# Patient Record
Sex: Female | Born: 1955 | Race: Black or African American | Hispanic: No | Smoking: Former smoker
Health system: Southern US, Community
[De-identification: ages and names within clinical notes are randomized; demographics above are authoritative.]

## PROBLEM LIST (undated history)

## (undated) DIAGNOSIS — H409 Unspecified glaucoma: Secondary | ICD-10-CM

## (undated) DIAGNOSIS — E785 Hyperlipidemia, unspecified: Secondary | ICD-10-CM

## (undated) DIAGNOSIS — F29 Unspecified psychosis not due to a substance or known physiological condition: Secondary | ICD-10-CM

## (undated) DIAGNOSIS — K219 Gastro-esophageal reflux disease without esophagitis: Secondary | ICD-10-CM

## (undated) DIAGNOSIS — E119 Type 2 diabetes mellitus without complications: Secondary | ICD-10-CM

## (undated) DIAGNOSIS — I10 Essential (primary) hypertension: Secondary | ICD-10-CM

## (undated) HISTORY — DX: Gastro-esophageal reflux disease without esophagitis: K21.9

## (undated) HISTORY — DX: Hyperlipidemia, unspecified: E78.5

## (undated) HISTORY — PX: ABDOMINAL HYSTERECTOMY: SHX81

## (undated) HISTORY — DX: Unspecified psychosis not due to a substance or known physiological condition: F29

## (undated) HISTORY — DX: Essential (primary) hypertension: I10

## (undated) HISTORY — DX: Unspecified glaucoma: H40.9

## (undated) HISTORY — DX: Type 2 diabetes mellitus without complications: E11.9

---

## 2012-08-06 ENCOUNTER — Ambulatory Visit (HOSPITAL_COMMUNITY): Payer: Self-pay | Admitting: Psychiatry

## 2012-08-14 ENCOUNTER — Ambulatory Visit (INDEPENDENT_AMBULATORY_CARE_PROVIDER_SITE_OTHER): Payer: 59 | Admitting: Psychiatry

## 2012-08-14 ENCOUNTER — Encounter (HOSPITAL_COMMUNITY): Payer: Self-pay | Admitting: Psychiatry

## 2012-08-14 VITALS — Ht 71.5 in | Wt 244.2 lb

## 2012-08-14 DIAGNOSIS — M79609 Pain in unspecified limb: Secondary | ICD-10-CM

## 2012-08-14 DIAGNOSIS — F29 Unspecified psychosis not due to a substance or known physiological condition: Secondary | ICD-10-CM

## 2012-08-14 MED ORDER — DICLOFENAC SODIUM 1 % TD GEL: 2.0000 g | Freq: Four times a day (QID) | TRANSDERMAL | Status: DC

## 2012-08-14 MED ORDER — HALOPERIDOL 1 MG PO TABS: 1.0000 mg | ORAL_TABLET | Freq: Three times a day (TID) | ORAL | Status: DC

## 2012-08-14 MED ORDER — BENZTROPINE MESYLATE 0.5 MG PO TABS: 0.5000 mg | ORAL_TABLET | Freq: Two times a day (BID) | ORAL | Status: DC

## 2012-08-14 NOTE — Patient Instructions (Signed)
Walking 8 minutes a day and increasing this gradually, BUT keeping a record and bringing that record in to the next visit would be very helpful.  Call if problems or concerns.  

## 2012-08-14 NOTE — Progress Notes (Signed)
Psychiatric Assessment Adult  Patient Identification:  Alexandria Schultz Date of Evaluation:  08/14/2012 Chief Complaint: "I've got a problem and I can't handle it myself.    History of Chief Complaint:   Beginning 9 years ago the pt began noticing smells and odors of things that were quite disturbing for her.  She got started on Zyprexa for this by her family doctor and got a good response.  She has since developed diabetes.  Her insurance company is not covering the Zyprexa any more.  She was referred to some psychiatrists in [place], but didn't go because she thought that she could handle this herself.  When she takes the meds she doesn't notice odors from everything, things in her environment and things that she eats.  Without the meds she notices them and hears people talking about her strong odors and feels rejection from them for that.   HPI Review of Systems  Constitutional: Negative.   HENT: Negative.   Eyes: Negative.   Respiratory: Negative.   Cardiovascular: Negative.   Gastrointestinal: Negative.   Endocrine: Positive for polydipsia and polyuria.  Musculoskeletal: Positive for myalgias.  Skin: Negative.   Allergic/Immunologic: Negative.   Neurological: Positive for light-headedness. Negative for dizziness, tremors, seizures, syncope, facial asymmetry, speech difficulty, weakness, numbness and headaches.  Hematological: Negative.   Psychiatric/Behavioral: Positive for hallucinations and behavioral problems. Negative for suicidal ideas, confusion, sleep disturbance, self-injury, dysphoric mood, decreased concentration and agitation. The patient is not nervous/anxious and is not hyperactive.    Physical Exam  Depressive Symptoms: decreased labido,  (Hypo) Manic Symptoms:   Irritable and delusions of a odor about her body, but no other symptoms  Anxiety Symptoms: Excessive Worry:  No Panic Symptoms:  No Agoraphobia:  No Obsessive Compulsive: No Specific Phobias:   No Social Anxiety:  No  Psychotic Symptoms:  Hallucinations: Yes Olfactory Delusions:  Yes Paranoia:  Yes   Ideas of Reference:  Yes See HPI  PTSD Symptoms: Ever had a traumatic exposure:  No Had a traumatic exposure in the last month:  No Re-experiencing: No  Hypervigilance:  No Hyperarousal: No Avoidance: No   Traumatic Brain Injury: No   Past Psychiatric History: Diagnosis: Schizophrenia by PCP who started Zyprexa   Hospitalizations: none  Outpatient Care: PCP  Substance Abuse Care: none  Self-Mutilation: none  Suicidal Attempts: none  Violent Behaviors: none   Past Medical History:   Past Medical History  Diagnosis Date  . Diabetes mellitus, type II   . GERD (gastroesophageal reflux disease)    History of Loss of Consciousness:  No Seizure History:  No Cardiac History:  No Allergies:  No Known Allergies Current Medications:  Current Outpatient Prescriptions  Medication Sig Dispense Refill  . atorvastatin (LIPITOR) 10 MG tablet       . lisinopril (PRINIVIL,ZESTRIL) 10 MG tablet       . metFORMIN (GLUCOPHAGE) 500 MG tablet       . timolol (TIMOPTIC) 0.5 % ophthalmic solution       . TRAVATAN Z 0.004 % SOLN ophthalmic solution       . Vitamin D, Ergocalciferol, (DRISDOL) 50000 UNITS CAPS       . HYDROcodone-acetaminophen (NORCO) 10-325 MG per tablet        No current facility-administered medications for this visit.    Previous Psychotropic Medications:  Medication Dose   Zyprexa  10 mg  Substance Abuse History in the last 12 months: Substance Age of 1st Use Last Use Amount Specific Type  Nicotine  15  12 years ago      Alcohol  18  2 days ago  80 oz beer    Cannabis  20  2 months ago      Opiates  56  yesterday  Tramodol    Cocaine  none        Methamphetamines  none        LSD  none        Ecstasy  none         Benzodiazepines  late 20's  30 yrs ago      Caffeine  20's   yesterday AM  1 cup  Folgers  Inhalants  none         Others:       Sugar  childhood  yesterday in coffee                  Medical Consequences of Substance Abuse: none  Legal Consequences of Substance Abuse: none  Family Consequences of Substance Abuse: none  Blackouts:  No DT's:  No Withdrawal Symptoms:  No   Social History: Current Place of Residence: [address] [place] [address] Place of Birth: [facility] Family Members: husband Marital Status:  Married Children: 0  Sons: 0  Daughters: 0 Relationships: husband Education:  10th grade completed, attended 11th grade, went to work Educational Problems/Performance: none Religious Beliefs/Practices: Holy History of Abuse: none Armed forces technical officer; Horticulturist, commercial History:  None. Legal History: none Hobbies/Interests: TV, occasionally garden and rarely goes for walks  Family History:   Family History  Problem Relation Age of Onset  . ADD / ADHD Neg Hx   . Alcohol abuse Neg Hx   . Drug abuse Neg Hx   . Bipolar disorder Neg Hx   . Dementia Neg Hx   . Depression Neg Hx   . OCD Neg Hx   . Paranoid behavior Neg Hx   . Schizophrenia Neg Hx   . Seizures Neg Hx   . Sexual abuse Neg Hx   . Physical abuse Neg Hx   . Anxiety disorder Other     Mental Status Examination/Evaluation: Objective:  Appearance: Casual  Eye Contact::  Good  Speech:  Clear and Coherent  Volume:  Normal  Mood:  Hopeful about this helping her.  Affect:  Congruent  Thought Process:  Coherent, Intact and Logical  Orientation:  Full (Time, Place, and Person)  Thought Content:  Hallucinations: Olfactory  Suicidal Thoughts:  No  Homicidal Thoughts:  No  Judgement:  Good  Insight:  Fair  Psychomotor Activity:  Normal  Akathisia:  No  Handed:  Right  AIMS (if indicated):    Assets:  Communication Skills Desire for Improvement    Laboratory/X-Ray Psychological Evaluation(s)   probably needs a head CT  none   Assessment:    AXIS I Psychotic Disorder NOS   AXIS II Deferred  AXIS III Past Medical History  Diagnosis Date  . Diabetes mellitus, type II   . GERD (gastroesophageal reflux disease)      AXIS IV other psychosocial or environmental problems  AXIS V 51-60 moderate symptoms   Treatment Plan/Recommendations:  Laboratory:  none  Psychotherapy: as needed  Medications: Haldol and have Cogentin available  Routine PRN Medications:  Yes  Consultations: none  Safety Concerns:  none  Other:     Plan: I  took her vitals.  I reviewed CC, tobacco/med/surg Hx, meds effects/ side effects, problem list, therapies and responses as well as current situation/symptoms discussed options. See orders and pt instructions for more details.  Medical Decision Making Problem Points:  New problem, with additional work-up planned (4) and Review of psycho-social stressors (1) Data Points:  Review or order clinical lab tests (1) Review of new medications or change in dosage (2)  I certify that outpatient services furnished can reasonably be expected to improve the patient's condition.   Orson Aloe, MD, [facility]

## 2012-09-12 ENCOUNTER — Encounter (HOSPITAL_COMMUNITY): Payer: Self-pay | Admitting: Psychiatry

## 2012-09-12 ENCOUNTER — Ambulatory Visit (INDEPENDENT_AMBULATORY_CARE_PROVIDER_SITE_OTHER): Payer: 59 | Admitting: Psychiatry

## 2012-09-12 VITALS — Wt 245.0 lb

## 2012-09-12 DIAGNOSIS — F29 Unspecified psychosis not due to a substance or known physiological condition: Secondary | ICD-10-CM

## 2012-09-12 DIAGNOSIS — F411 Generalized anxiety disorder: Secondary | ICD-10-CM | POA: Insufficient documentation

## 2012-09-12 MED ORDER — HALOPERIDOL 1 MG PO TABS: 1.0000 mg | ORAL_TABLET | Freq: Three times a day (TID) | ORAL | Status: DC

## 2012-09-12 MED ORDER — DIAZEPAM 5 MG PO TABS: ORAL_TABLET | ORAL | Status: DC

## 2012-09-12 NOTE — Progress Notes (Signed)
[facility] [id number] Progress Note ERSEL WADLEIGH MRN: [id number] DOB: July 28, 1955 Age: 57 y.o.  Date: 09/12/2012 Start Time: 9:25 AM End Time: 9:50 AM  Chief Complaint: Chief Complaint  Patient presents with  . Other  . Follow-up  . Medication Refill   Subjective: "I'm still smelling myself, but I have not needed the medicine for the stiffness".   Psychosis is 9/10 where 0 is none and 10 is the worst.  Pt returns for follow-up appointment.   Pt reports that she is compliant with the psychotropic medications with fair benefit and no noticeable side effects.  She is able to eat without feeling her insides being on fire.  She still smells herself quite a bit and that gives her the score of 9.  She never got the CT of the head.  She hates to be in tight spaces and has been on Valium before.  Will prescribe that for the CT procedure.  History of Chief Complaint:   Beginning 9 years ago the pt began noticing smells and odors of things that were quite disturbing for her.  She got started on Zyprexa for this by her family doctor and got a good response.  She has since developed diabetes.  Her insurance company is not covering the Zyprexa any more.  She was referred to some psychiatrists in [place], but didn't go because she thought that she could handle this herself.  When she takes the meds she doesn't notice odors from everything, things in her environment and things that she eats.  Without the meds she notices them and hears people talking about her strong odors and feels rejection from them for that.   HPI Review of Systems  Constitutional: Negative.   HENT: Negative.   Eyes: Negative.   Respiratory: Negative.   Cardiovascular: Negative.   Gastrointestinal: Negative.   Endocrine: Positive for polydipsia and polyuria.  Musculoskeletal: Positive for myalgias.  Skin: Negative.   Allergic/Immunologic: Negative.   Neurological: Positive for light-headedness. Negative for dizziness,  tremors, seizures, syncope, facial asymmetry, speech difficulty, weakness, numbness and headaches.  Hematological: Negative.   Psychiatric/Behavioral: Positive for hallucinations and behavioral problems. Negative for suicidal ideas, confusion, sleep disturbance, self-injury, dysphoric mood, decreased concentration and agitation. The patient is not nervous/anxious and is not hyperactive.    Physical Exam  Depressive Symptoms: decreased labido,  (Hypo) Manic Symptoms:   Irritable and delusions of a odor about her body, but no other symptoms  Anxiety Symptoms: Excessive Worry:  No Panic Symptoms:  No Agoraphobia:  No Obsessive Compulsive: No Specific Phobias:  No Social Anxiety:  No  Psychotic Symptoms:  Hallucinations: Yes Olfactory Delusions:  Yes Paranoia:  Yes   Ideas of Reference:  Yes See HPI  PTSD Symptoms: Ever had a traumatic exposure:  No Had a traumatic exposure in the last month:  No Re-experiencing: No  Hypervigilance:  No Hyperarousal: No Avoidance: No   Traumatic Brain Injury: No   Past Psychiatric History: Diagnosis: Schizophrenia by PCP who started Zyprexa   Hospitalizations: none  Outpatient Care: PCP  Substance Abuse Care: none  Self-Mutilation: none  Suicidal Attempts: none  Violent Behaviors: none   Past Medical History:   Past Medical History  Diagnosis Date  . Diabetes mellitus, type II   . GERD (gastroesophageal reflux disease)   . Psychosis    History of Loss of Consciousness:  No Seizure History:  No Cardiac History:  No Allergies:  No Known Allergies Current Medications:  Current Outpatient Prescriptions  Medication Sig Dispense Refill  . diclofenac sodium (VOLTAREN) 1 % GEL Apply 2 g topically 4 (four) times daily.  100 g  0  . haloperidol (HALDOL) 1 MG tablet Take 1 tablet (1 mg total) by mouth 3 (three) times daily. Try 1/2 tab first then increase to the dose that works.  90 tablet  2  . Vitamin D, Ergocalciferol, (DRISDOL) 50000  UNITS CAPS       . atorvastatin (LIPITOR) 10 MG tablet       . benztropine (COGENTIN) 0.5 MG tablet Take 1 tablet (0.5 mg total) by mouth 2 (two) times daily. Only take for stiffness, may cause dry mouth, blurred vision, and constipation.  60 tablet  1  . HYDROcodone-acetaminophen (NORCO) 10-325 MG per tablet       . lisinopril (PRINIVIL,ZESTRIL) 10 MG tablet       . metFORMIN (GLUCOPHAGE) 500 MG tablet       . timolol (TIMOPTIC) 0.5 % ophthalmic solution       . TRAVATAN Z 0.004 % SOLN ophthalmic solution        No current facility-administered medications for this visit.   Previous Psychotropic Medications:  Medication Dose   Zyprexa  10 mg                     Substance Abuse History in the last 12 months: Substance Age of 1st Use Last Use Amount Specific Type  Nicotine  15  12 years ago      Alcohol  18  2 days ago  80 oz beer    Cannabis  20  2 months ago      Opiates  56  yesterday  Tramodol    Cocaine  none        Methamphetamines  none        LSD  none        Ecstasy  none         Benzodiazepines  late 20's  30 yrs ago      Caffeine  20's   yesterday AM  1 cup  Folgers  Inhalants  none        Others:       Sugar  childhood  yesterday in coffee                  Medical Consequences of Substance Abuse: none  Legal Consequences of Substance Abuse: none  Family Consequences of Substance Abuse: none  Blackouts:  No DT's:  No Withdrawal Symptoms:  No   Social History: Current Place of Residence: [address] [place] [address] Place of Birth: [facility] Family Members: husband Marital Status:  Married Children: 0  Sons: 0  Daughters: 0 Relationships: husband Education:  10th grade completed, attended 11th grade, went to work Educational Problems/Performance: none Religious Beliefs/Practices: Holy History of Abuse: none Armed forces technical officer; Horticulturist, commercial History:  None. Legal History: none Hobbies/Interests: TV,  occasionally garden and rarely goes for walks  Family History:   Family History  Problem Relation Age of Onset  . ADD / ADHD Neg Hx   . Alcohol abuse Neg Hx   . Drug abuse Neg Hx   . Bipolar disorder Neg Hx   . Dementia Neg Hx   . Depression Neg Hx   . OCD Neg Hx   . Paranoid behavior Neg Hx   . Schizophrenia Neg Hx   . Seizures Neg Hx   . Sexual abuse Neg Hx   .  Physical abuse Neg Hx   . Anxiety disorder Other     Mental Status Examination/Evaluation: Objective:  Appearance: Casual  Eye Contact::  Good  Speech:  Clear and Coherent  Volume:  Normal  Mood:  Hopeful about this helping her.  Affect:  Congruent  Thought Process:  Coherent, Intact and Logical  Orientation:  Full (Time, Place, and Person)  Thought Content:  Hallucinations: Olfactory  Suicidal Thoughts:  No  Homicidal Thoughts:  No  Judgement:  Good  Insight:  Fair  Psychomotor Activity:  Normal  Akathisia:  No  Handed:  Right  AIMS (if indicated):    Assets:  Communication Skills Desire for Improvement    Laboratory/X-Ray Psychological Evaluation(s)   probably needs a head CT  none   Assessment:    AXIS I Psychotic Disorder NOS  AXIS II Deferred  AXIS III Past Medical History  Diagnosis Date  . Diabetes mellitus, type II   . GERD (gastroesophageal reflux disease)   . Psychosis      AXIS IV other psychosocial or environmental problems  AXIS V 51-60 moderate symptoms   Treatment Plan/Recommendations:  Laboratory:  none  Psychotherapy: as needed  Medications: Haldol and have Cogentin available  Routine PRN Medications:  Yes  Consultations: none  Safety Concerns:  none  Other:     Plan: I took her vitals.  I reviewed CC, tobacco/med/surg Hx, meds effects/ side effects, problem list, therapies and responses as well as current situation/symptoms discussed options. Increase Haldol which seems to be working some.  Cont Cogentin as needed.  See orders and pt instructions for more  details.  Medical Decision Making Problem Points:  Established problem, stable/improving (1), Review of last therapy session (1) and Review of psycho-social stressors (1) Data Points:  Review or order clinical lab tests (1) Review of medication regiment & side effects (2) Review of new medications or change in dosage (2)  I certify that outpatient services furnished can reasonably be expected to improve the patient's condition.   Orson Aloe, MD, [facility]

## 2012-09-12 NOTE — Patient Instructions (Addendum)
Relaxation is the ultimate solution for you.  You can seek it through tub baths, bubble baths, essential oils or incense, walking or chatting with friends, listening to soft music, watching a candle burn and just letting all thoughts go and appreciating the true essence of the Creator.   Make an appointment with Peggy for counseling.  Call if problems or concerns.  Call if we hear anything more about the CT scan.

## 2012-09-18 ENCOUNTER — Ambulatory Visit (HOSPITAL_COMMUNITY): Payer: 59 | Admitting: Psychiatry

## 2012-09-19 ENCOUNTER — Ambulatory Visit (HOSPITAL_COMMUNITY): Payer: Managed Care, Other (non HMO)

## 2012-09-26 ENCOUNTER — Telehealth (HOSPITAL_COMMUNITY): Payer: Self-pay | Admitting: Psychiatry

## 2012-09-26 NOTE — Telephone Encounter (Signed)
Preauth for CT of head approved by phone with a Lexicographer at

## 2012-10-07 ENCOUNTER — Telehealth (HOSPITAL_COMMUNITY): Payer: Self-pay | Admitting: *Deleted

## 2012-10-13 ENCOUNTER — Encounter (HOSPITAL_COMMUNITY): Payer: Self-pay | Admitting: *Deleted

## 2012-10-13 ENCOUNTER — Ambulatory Visit (HOSPITAL_COMMUNITY): Payer: 59 | Admitting: Psychiatry

## 2012-10-14 ENCOUNTER — Telehealth (HOSPITAL_COMMUNITY): Payer: Self-pay | Admitting: Psychiatry

## 2012-10-14 ENCOUNTER — Ambulatory Visit (HOSPITAL_COMMUNITY)
Admission: RE | Admit: 2012-10-14 | Discharge: 2012-10-14 | Disposition: A | Discharge status: Home / Self Care | Payer: Managed Care, Other (non HMO) | Source: Ambulatory Visit | Attending: Psychiatry | Admitting: Psychiatry

## 2012-10-14 DIAGNOSIS — F29 Unspecified psychosis not due to a substance or known physiological condition: Secondary | ICD-10-CM

## 2012-10-14 DIAGNOSIS — R51 Headache: Secondary | ICD-10-CM | POA: Insufficient documentation

## 2012-10-14 NOTE — Telephone Encounter (Signed)
Answering machine did not identify the owner, so left message that this is Dr W trying got get a hold of someone with the initials of [employer] And requested that they call me back.

## 2012-10-20 ENCOUNTER — Ambulatory Visit (HOSPITAL_COMMUNITY): Payer: 59 | Admitting: Psychiatry

## 2012-10-24 ENCOUNTER — Ambulatory Visit (INDEPENDENT_AMBULATORY_CARE_PROVIDER_SITE_OTHER): Payer: 59 | Admitting: Psychiatry

## 2012-10-24 ENCOUNTER — Encounter (HOSPITAL_COMMUNITY): Payer: Self-pay | Admitting: Psychiatry

## 2012-10-24 VITALS — BP 131/92 | HR 61 | Ht 71.25 in | Wt 239.8 lb

## 2012-10-24 DIAGNOSIS — F411 Generalized anxiety disorder: Secondary | ICD-10-CM

## 2012-10-24 DIAGNOSIS — F29 Unspecified psychosis not due to a substance or known physiological condition: Secondary | ICD-10-CM

## 2012-10-24 DIAGNOSIS — M79609 Pain in unspecified limb: Secondary | ICD-10-CM

## 2012-10-24 MED ORDER — CHLORPROMAZINE HCL 25 MG PO TABS: 25.0000 mg | ORAL_TABLET | Freq: Three times a day (TID) | ORAL | Status: DC

## 2012-10-24 NOTE — Patient Instructions (Addendum)
Try to find the best anxiety controlling dose that you can get with the new medicine THORAZINE, then add back the Haldol if the smells and voices are still troublesome.  Set a timer for 8 or a certain number minutes and walk for that amount of time in the house or in the yard.  Mark the number of minutes on a calendar for that day.  Do that every day this week.  Then next week increase the time by 1 minutes and then mark the calendar with the number of minutes for that day.  Each week increase your exercise by one minute.  Keep a record of this so you can see the progress you are making.  Do this every day, just like eating and sleeping.  It is good for pain control, depression, and for your soul/spirit.  Bring the record in for your next visit so we can talk about your effort and how you feel with the new exercise program going and working for you.  Take care of yourself.  No one else is standing up to do the job and only you know what you need.   GET SERIOUS about taking care of yourself.  Do the next right thing and that often means doing something to care for yourself along the lines of are you hungry, are you angry, are you lonely, are you tired, are you scared?  [employer] is what that stands for.  Call if problems or concerns.

## 2012-10-24 NOTE — Progress Notes (Signed)
[facility] [id number] Progress Note OLISA QUESNEL MRN: [id number] DOB: 05-24-1956 Age: 57 y.o. Date: 10/24/2012 Start Time: 9:00 AM End Time: 9:23 AM  Chief Complaint: Chief Complaint  Patient presents with  . Other  . Follow-up  . Medication Refill   Subjective: "I'm still smelling myself and am now hearing voices".   Depression 10/10 and Anxiety 10/10, where 0 is none and 10 is the worst.  Psychosis is 0/10 today.  Pain is 0/10 today.  Pt returns for follow-up appointment.   Pt reports that she is compliant with the psychotropic medications with fair benefit and no noticeable side effects.  Her CT was typical.  She is noting considerable anxiety.  Discussed on a grid Zyprexa, Risperdal, Seroquel, Geodon, Abilify, Haldol, and Thorazine with cost, relief of psychosis/voices/smells, anxiety relief, and risk associated with diabetes.  Concluded that we will try Thorazine and then add Haldol back for the psychosis control.  Also reviewed the similarity of Prolixin and Navane for the psychosis control.  She can not afford the Zyprexa and doesn't want the diabetes risk with the atypical antipsychotics.    Pt seemed surprise that I suspected that she was having voices.  I explained that it is a typical associated symptom.   History of Chief Complaint:   Beginning 9 years ago the pt began noticing smells and odors of things that were quite disturbing for her.  She got started on Zyprexa for this by her family doctor and got a good response.  She has since developed diabetes.  Her insurance company is not covering the Zyprexa any more.  She was referred to some psychiatrists in [place], but didn't go because she thought that she could handle this herself.  When she takes the meds she doesn't notice odors from everything, things in her environment and things that she eats.  Without the meds she notices them and hears people talking about her strong odors and feels rejection from them for that.    HPI Review of Systems  Constitutional: Negative.   HENT: Negative.   Eyes: Negative.   Respiratory: Negative.   Cardiovascular: Negative.   Gastrointestinal: Negative.   Endocrine: Positive for polydipsia and polyuria.  Musculoskeletal: Positive for myalgias.  Skin: Negative.   Allergic/Immunologic: Negative.   Neurological: Positive for light-headedness. Negative for dizziness, tremors, seizures, syncope, facial asymmetry, speech difficulty, weakness, numbness and headaches.  Hematological: Negative.   Psychiatric/Behavioral: Positive for hallucinations and behavioral problems. Negative for suicidal ideas, confusion, sleep disturbance, self-injury, dysphoric mood, decreased concentration and agitation. The patient is not nervous/anxious and is not hyperactive.    Physical Exam  Depressive Symptoms: decreased labido,  (Hypo) Manic Symptoms:   Irritable and delusions of a odor about her body, but no other symptoms  Anxiety Symptoms: Excessive Worry:  No Panic Symptoms:  No Agoraphobia:  No Obsessive Compulsive: No Specific Phobias:  No Social Anxiety:  No  Psychotic Symptoms:  Hallucinations: Yes Olfactory Delusions:  Yes Paranoia:  Yes   Ideas of Reference:  Yes See HPI  PTSD Symptoms: Ever had a traumatic exposure:  No Had a traumatic exposure in the last month:  No Re-experiencing: No  Hypervigilance:  No Hyperarousal: No Avoidance: No   Traumatic Brain Injury: No   Past Psychiatric History: Diagnosis: Schizophrenia by PCP who started Zyprexa   Hospitalizations: none  Outpatient Care: PCP  Substance Abuse Care: none  Self-Mutilation: none  Suicidal Attempts: none  Violent Behaviors: none   Allergies: No Known Allergies  Past Medical History:   Past Medical History  Diagnosis Date  . Diabetes mellitus, type II   . GERD (gastroesophageal reflux disease)   . Psychosis    Past Surgical History: Past Surgical History  Procedure Laterality Date  .  Abdominal hysterectomy     History of Loss of Consciousness:  No Seizure History:  No Cardiac History:  No Allergies:  No Known Allergies Current Medications:  Current Outpatient Prescriptions  Medication Sig Dispense Refill  . atorvastatin (LIPITOR) 10 MG tablet       . benztropine (COGENTIN) 0.5 MG tablet Take 1 tablet (0.5 mg total) by mouth 2 (two) times daily. Only take for stiffness, may cause dry mouth, blurred vision, and constipation.  60 tablet  1  . diclofenac sodium (VOLTAREN) 1 % GEL Apply 2 g topically 4 (four) times daily.  100 g  0  . haloperidol (HALDOL) 1 MG tablet Take 1 tablet (1 mg total) by mouth 3 (three) times daily. Try 1 tab three times a day adjust the dose for what works best for you.  90 tablet  2  . HYDROcodone-acetaminophen (NORCO) 10-325 MG per tablet       . lisinopril (PRINIVIL,ZESTRIL) 10 MG tablet       . metFORMIN (GLUCOPHAGE) 500 MG tablet       . timolol (TIMOPTIC) 0.5 % ophthalmic solution       . TRAVATAN Z 0.004 % SOLN ophthalmic solution       . Vitamin D, Ergocalciferol, (DRISDOL) 50000 UNITS CAPS        No current facility-administered medications for this visit.   Previous Psychotropic Medications:  Medication Dose   Zyprexa  10 mg   Substance Abuse History in the last 12 months: Substance Age of 1st Use Last Use Amount Specific Type  Nicotine  15  12 years ago      Alcohol  18  2 days ago  80 oz beer    Cannabis  20  2 months ago      Opiates  56  yesterday  Tramodol    Cocaine  none        Methamphetamines  none        LSD  none        Ecstasy  none         Benzodiazepines  late 20's  30 yrs ago      Caffeine  20's   yesterday AM  1 cup  Folgers  Inhalants  none        Others:       Sugar  childhood  yesterday in coffee     Medical Consequences of Substance Abuse: none  Legal Consequences of Substance Abuse: none  Family Consequences of Substance Abuse: none  Blackouts:  No DT's:  No Withdrawal Symptoms:  No   Social  History: Current Place of Residence: [address] [place] [address] Place of Birth: [facility] Family Members: husband Marital Status:  Married Children: 0  Sons: 0  Daughters: 0 Relationships: husband Education:  10th grade completed, attended 11th grade, went to work Educational Problems/Performance: none Religious Beliefs/Practices: Holy History of Abuse: none Armed forces technical officer; Horticulturist, commercial History:  None. Legal History: none Hobbies/Interests: TV, occasionally garden and rarely goes for walks  Family History:   Family History  Problem Relation Age of Onset  . ADD / ADHD Neg Hx   . Alcohol abuse Neg Hx   . Drug abuse Neg Hx   .  Bipolar disorder Neg Hx   . Dementia Neg Hx   . Depression Neg Hx   . OCD Neg Hx   . Paranoid behavior Neg Hx   . Schizophrenia Neg Hx   . Seizures Neg Hx   . Sexual abuse Neg Hx   . Physical abuse Neg Hx   . Anxiety disorder Other    Mental Status Examination/Evaluation: Objective:  Appearance: Casual  Eye Contact::  Good  Speech:  Clear and Coherent  Volume:  Normal  Mood:  Hopeful about this helping her.  Affect:  Congruent  Thought Process:  Coherent, Intact and Logical  Orientation:  Full (Time, Place, and Person)  Thought Content:  Hallucinations: Olfactory  Suicidal Thoughts:  No  Homicidal Thoughts:  No  Judgement:  Good  Insight:  Fair  Psychomotor Activity:  Normal  Akathisia:  No  Handed:  Right  AIMS (if indicated):    Assets:  Communication Skills Desire for Improvement   Lab Results: No results found for this or any previous visit (from the past 2016 hour(s)). CT showed typical brain  Assessment:    AXIS I Psychotic Disorder NOS  AXIS II Deferred  AXIS III Past Medical History  Diagnosis Date  . Diabetes mellitus, type II   . GERD (gastroesophageal reflux disease)   . Psychosis     AXIS IV other psychosocial or environmental problems  AXIS V 51-60 moderate symptoms    Treatment Plan/Recommendations:  Laboratory:  none  Psychotherapy: as needed  Medications: Haldol and have Cogentin available  Routine PRN Medications:  Yes  Consultations: none  Safety Concerns:  none  Other:     Plan: I took her vitals.  I reviewed CC, tobacco/med/surg Hx, meds effects/ side effects, problem list, therapies and responses as well as current situation/symptoms discussed options. Switch to Thorazine for anxiety control and consider how effective it is for psychosis, add Haldol back if needed.  Use Cogentin with the Haldol See orders and pt instructions for more details.  MEDICATIONS this encounter: Meds ordered this encounter  Medications  . chlorproMAZINE (THORAZINE) 25 MG tablet    Sig: Take 1 tablet (25 mg total) by mouth 3 (three) times daily.    Dispense:  90 tablet    Refill:  1   Medical Decision Making Problem Points:  Established problem, stable/improving (1), Review of last therapy session (1) and Review of psycho-social stressors (1) Data Points:  Review or order clinical lab tests (1) Review of medication regiment & side effects (2) Review of new medications or change in dosage (2)  I certify that outpatient services furnished can reasonably be expected to improve the patient's condition.   Orson Aloe, MD, [facility]

## 2012-11-14 ENCOUNTER — Encounter (HOSPITAL_COMMUNITY): Payer: Self-pay | Admitting: Psychiatry

## 2012-11-14 ENCOUNTER — Ambulatory Visit (INDEPENDENT_AMBULATORY_CARE_PROVIDER_SITE_OTHER): Payer: 59 | Admitting: Psychiatry

## 2012-11-14 VITALS — BP 127/80 | HR 53 | Ht 71.0 in | Wt 233.0 lb

## 2012-11-14 DIAGNOSIS — F29 Unspecified psychosis not due to a substance or known physiological condition: Secondary | ICD-10-CM

## 2012-11-14 DIAGNOSIS — F411 Generalized anxiety disorder: Secondary | ICD-10-CM

## 2012-11-14 MED ORDER — CHLORPROMAZINE HCL 25 MG PO TABS: 25.0000 mg | ORAL_TABLET | Freq: Three times a day (TID) | ORAL | Status: DC

## 2012-11-14 NOTE — Patient Instructions (Signed)
You can add a small piece of the Thorazine during the day if you need it.  Set a timer for 8 or a certain number minutes and walk for that amount of time in the house or in the yard.  Mark the number of minutes on a calendar for that day.  Do that every day this week.  Then next week increase the time by 1 minutes and then mark the calendar with the number of minutes for that day.  Each week increase your exercise by one minute.  Keep a record of this so you can see the progress you are making.  Do this every day, just like eating and sleeping.  It is good for pain control, depression, and for your soul/spirit.  Bring the record in for your next visit so we can talk about your effort and how you feel with the new exercise program going and working for you.  "I am Wishes Fulfilled Meditation" by Marylene Buerger and Lyndal Pulley may be helpful MUSIC for getting to sleep or for meditating You can order it from on line.  You might find the Chill channel on Pandora and explore the artists that you like better.   Take care of yourself.  No one else is standing up to do the job and only you know what you need.   GET SERIOUS about taking care of yourself.  Do the next right thing and that often means doing something to care for yourself along the lines of are you hungry, are you angry, are you lonely, are you tired, are you scared?  HALTS is what that stands for.  Call if problems or concerns.  You will be seeing a different doctor at your next visit.  I have enjoyed working with you and feel privileged to have worked with you.

## 2012-11-14 NOTE — Progress Notes (Signed)
[facility] [id number] Progress Note LASHARN BUFKIN MRN: [id number] DOB: [birth year] Age: 57 y.o. Date: 11/14/2012 Start Time: 9:58 AM End Time: 10:06 AM  Chief Complaint: Chief Complaint  Patient presents with  . Other  . Follow-up  . Medication Refill   Subjective: "I'm smelling myself maybe a little bit, but with the medicine I am better.  I am not hearing voices as much".   Depression 0/10 and Anxiety 0/10, where 0 is none and 10 is the worst.  Psychosis is 2/10 today.  Pain is 0/10 today.  Pt returns for follow-up appointment.   Pt reports that she is compliant with the psychotropic medications with good benefit and some side effects.  She is noting some constipation with the Thorazine.  She can't take it three times a day because of sedation.  Once a day makes things a lot better for her.  History of Chief Complaint:   Beginning 9 years ago the pt began noticing smells and odors of things that were quite disturbing for her.  She got started on Zyprexa for this by her family doctor and got a good response.  She has since developed diabetes.  Her insurance company is not covering the Zyprexa any more.  She was referred to some psychiatrists in [place], but didn't go because she thought that she could handle this herself.  When she takes the meds she doesn't notice odors from everything, things in her environment and things that she eats.  Without the meds she notices them and hears people talking about her strong odors and feels rejection from them for that.   HPI Review of Systems  Constitutional: Negative.   HENT: Negative.   Eyes: Negative.   Respiratory: Negative.   Cardiovascular: Negative.   Gastrointestinal: Negative.   Endocrine: Positive for polydipsia and polyuria.  Musculoskeletal: Positive for myalgias.  Skin: Negative.   Allergic/Immunologic: Negative.   Neurological: Positive for light-headedness. Negative for dizziness, tremors, seizures, syncope, facial  asymmetry, speech difficulty, weakness, numbness and headaches.  Hematological: Negative.   Psychiatric/Behavioral: Positive for hallucinations and behavioral problems. Negative for suicidal ideas, confusion, sleep disturbance, self-injury, dysphoric mood, decreased concentration and agitation. The patient is not nervous/anxious and is not hyperactive.    Physical Exam  Depressive Symptoms: decreased labido,  (Hypo) Manic Symptoms:   Irritable and delusions of a odor about her body, but no other symptoms  Anxiety Symptoms: Excessive Worry:  No Panic Symptoms:  No Agoraphobia:  No Obsessive Compulsive: No Specific Phobias:  No Social Anxiety:  No  Psychotic Symptoms:  Hallucinations: Yes Olfactory Delusions:  Yes Paranoia:  Yes   Ideas of Reference:  Yes See HPI  PTSD Symptoms: Ever had a traumatic exposure:  No Had a traumatic exposure in the last month:  No Re-experiencing: No  Hypervigilance:  No Hyperarousal: No Avoidance: No   Traumatic Brain Injury: No   Past Psychiatric History: Diagnosis: Schizophrenia by PCP who started Zyprexa   Hospitalizations: none  Outpatient Care: PCP  Substance Abuse Care: none  Self-Mutilation: none  Suicidal Attempts: none  Violent Behaviors: none   Allergies: No Known Allergies  Past Medical History:   Past Medical History  Diagnosis Date  . Diabetes mellitus, type II   . GERD (gastroesophageal reflux disease)   . Psychosis    Past Surgical History: Past Surgical History  Procedure Laterality Date  . Abdominal hysterectomy     Family History: family history includes Anxiety disorder in her other.  There is no  history of ADD / ADHD, and Alcohol abuse, and Drug abuse, and Bipolar disorder, and Dementia, and Depression, and OCD, and Paranoid behavior, and Schizophrenia, and Seizures, and Sexual abuse, and Physical abuse, . Reviewed all this again today and it is the same  History of Loss of Consciousness:  No Seizure  History:  No Cardiac History:  No Allergies:  No Known Allergies Current Medications:  Current outpatient prescriptions:benztropine (COGENTIN) 0.5 MG tablet, Take 1 tablet (0.5 mg total) by mouth 2 (two) times daily. Only take for stiffness, may cause dry mouth, blurred vision, and constipation., Disp: 60 tablet, Rfl: 1;  chlorproMAZINE (THORAZINE) 25 MG tablet, Take 1 tablet (25 mg total) by mouth 3 (three) times daily., Disp: 270 tablet, Rfl: 0;  atorvastatin (LIPITOR) 10 MG tablet, , Disp: , Rfl:  diclofenac sodium (VOLTAREN) 1 % GEL, Apply 2 g topically 4 (four) times daily., Disp: 100 g, Rfl: 0;  HYDROcodone-acetaminophen (NORCO) 10-325 MG per tablet, , Disp: , Rfl: ;  lisinopril (PRINIVIL,ZESTRIL) 10 MG tablet, , Disp: , Rfl: ;  metFORMIN (GLUCOPHAGE) 500 MG tablet, , Disp: , Rfl: ;  timolol (TIMOPTIC) 0.5 % ophthalmic solution, , Disp: , Rfl: ;  TRAVATAN Z 0.004 % SOLN ophthalmic solution, , Disp: , Rfl:  Vitamin D, Ergocalciferol, (DRISDOL) 50000 UNITS CAPS, , Disp: , Rfl:   Previous Psychotropic Medications:  Medication Dose   Zyprexa  10 mg   Substance Abuse History in the last 12 months: Substance Age of 1st Use Last Use Amount Specific Type  Nicotine  15  12 years ago      Alcohol  18  2 days ago  80 oz beer    Cannabis  20  2 months ago      Opiates  56  yesterday  Tramodol    Cocaine  none        Methamphetamines  none        LSD  none        Ecstasy  none         Benzodiazepines  late 20's  30 yrs ago      Caffeine  20's   yesterday AM  1 cup  Folgers  Inhalants  none        Others:       Sugar  childhood  yesterday in coffee     Medical Consequences of Substance Abuse: none  Legal Consequences of Substance Abuse: none  Family Consequences of Substance Abuse: none  Blackouts:  No DT's:  No Withdrawal Symptoms:  No   Social History: Current Place of Residence: [address] [place] [address] Place of Birth: [facility] Family Members: husband Marital  Status:  Married Children: 0  Sons: 0  Daughters: 0 Relationships: husband Education:  10th grade completed, attended 11th grade, went to work Educational Problems/Performance: none Religious Beliefs/Practices: Holy History of Abuse: none Armed forces technical officer; Horticulturist, commercial History:  None. Legal History: none Hobbies/Interests: TV, occasionally garden and rarely goes for walks  Family History:   Family History  Problem Relation Age of Onset  . ADD / ADHD Neg Hx   . Alcohol abuse Neg Hx   . Drug abuse Neg Hx   . Bipolar disorder Neg Hx   . Dementia Neg Hx   . Depression Neg Hx   . OCD Neg Hx   . Paranoid behavior Neg Hx   . Schizophrenia Neg Hx   . Seizures Neg Hx   . Sexual abuse Neg  Hx   . Physical abuse Neg Hx   . Anxiety disorder Other    Mental Status Examination/Evaluation: Objective:  Appearance: Casual  Eye Contact::  Good  Speech:  Clear and Coherent  Volume:  Normal  Mood:  Hopeful about this helping her.  Affect:  Congruent  Thought Process:  Coherent, Intact and Logical  Orientation:  Full (Time, Place, and Person)  Thought Content:  Hallucinations: Olfactory  Suicidal Thoughts:  No  Homicidal Thoughts:  No  Judgement:  Good  Insight:  Fair  Psychomotor Activity:  Normal  Akathisia:  No  Handed:  Right  AIMS (if indicated):    Assets:  Communication Skills Desire for Improvement   Lab Results: No results found for this or any previous visit (from the past 2016 hour(s)). CT showed typical brain  Assessment:    AXIS I Psychotic Disorder NOS  AXIS II Deferred  AXIS III Past Medical History  Diagnosis Date  . Diabetes mellitus, type II   . GERD (gastroesophageal reflux disease)   . Psychosis     AXIS IV other psychosocial or environmental problems  AXIS V 51-60 moderate symptoms   Treatment Plan/Recommendations:  Laboratory:  none  Psychotherapy: as needed  Medications: Haldol and have Cogentin available  Routine PRN  Medications:  Yes  Consultations: none  Safety Concerns:  none  Other:     Plan: I took her vitals.  I reviewed CC, tobacco/med/surg Hx, meds effects/ side effects, problem list, therapies and responses as well as current situation/symptoms discussed options. Continue Thorazine See orders and pt instructions for more details.  MEDICATIONS this encounter: Meds ordered this encounter  Medications  . chlorproMAZINE (THORAZINE) 25 MG tablet    Sig: Take 1 tablet (25 mg total) by mouth 3 (three) times daily.    Dispense:  270 tablet    Refill:  0   Medical Decision Making Problem Points:  Established problem, stable/improving (1), Review of last therapy session (1) and Review of psycho-social stressors (1) Data Points:  Review or order clinical lab tests (1) Review of medication regiment & side effects (2) Review of new medications or change in dosage (2)  I certify that outpatient services furnished can reasonably be expected to improve the patient's condition.   Orson Aloe, MD, [facility]

## 2013-01-13 ENCOUNTER — Ambulatory Visit (HOSPITAL_COMMUNITY): Payer: 59 | Admitting: Psychiatry

## 2013-05-01 ENCOUNTER — Encounter (HOSPITAL_COMMUNITY): Payer: Self-pay | Admitting: Psychiatry

## 2013-05-01 ENCOUNTER — Ambulatory Visit (INDEPENDENT_AMBULATORY_CARE_PROVIDER_SITE_OTHER): Payer: 59 | Admitting: Psychiatry

## 2013-05-01 VITALS — BP 140/80 | Ht 71.0 in | Wt 222.0 lb

## 2013-05-01 DIAGNOSIS — F29 Unspecified psychosis not due to a substance or known physiological condition: Secondary | ICD-10-CM

## 2013-05-01 DIAGNOSIS — M79609 Pain in unspecified limb: Secondary | ICD-10-CM

## 2013-05-01 DIAGNOSIS — F411 Generalized anxiety disorder: Secondary | ICD-10-CM

## 2013-05-01 MED ORDER — CHLORPROMAZINE HCL 25 MG PO TABS: 25.0000 mg | ORAL_TABLET | Freq: Three times a day (TID) | ORAL | Status: DC

## 2013-05-01 NOTE — Progress Notes (Signed)
Patient ID: Alexandria Schultz, female   DOB: Apr 12, 1956, 57 y.o.   MRN: [id number] [facility] [id number] Progress Note ANIQA HARE MRN: [id number] DOB: 10-20-1955 Age: 58 y.o. Date: 05/01/2013 Start Time: 9:58 AM End Time: 10:06 AM  Chief Complaint: Chief Complaint  Patient presents with  . Altered Mental Status  . Follow-up   Subjective: "I'm smelling myself maybe a little bit, but with the medicine I am better.  I am not hearing voices as much".   This patient is a 57 year old black female lives with her husband in [place]. She works in a Geneticist, molecular. She has no children.  Apparently at the patient started having odd smells after she ate food. This started several years ago. She was started on Zyprexa but she couldn't afford it and she suspects it caused her diabetes. She claims now that she is on Thorazine and smells have gone away and she's not hearing any voices or having paranoid symptoms. She claims this happened because she was "stressed" but doesn't want to talk about what the stress entailed. She states her mood is good and she is sleeping well. Of note both she and her husband drink a quart of beer per day. I explained that this was way too much and this would need to be cut back. I don't know her primary doctor has checked a liver panel but we will get the records.  History of Chief Complaint:   Beginning 9 years ago the pt began noticing smells and odors of things that were quite disturbing for her.  She got started on Zyprexa for this by her family doctor and got a good response.  She has since developed diabetes.  Her insurance company is not covering the Zyprexa any more.  She was referred to some psychiatrists in [place], but didn't go because she thought that she could handle this herself.  When she takes the meds she doesn't notice odors from everything, things in her environment and things that she eats.  Without the meds she notices them and hears people talking about her  strong odors and feels rejection from them for that.   Altered Mental Status Associated symptoms include myalgias. Pertinent negatives include no headaches, numbness or weakness.   Review of Systems  Constitutional: Negative.   HENT: Negative.   Eyes: Negative.   Respiratory: Negative.   Cardiovascular: Negative.   Gastrointestinal: Negative.   Endocrine: Positive for polydipsia and polyuria.  Musculoskeletal: Positive for myalgias.  Skin: Negative.   Allergic/Immunologic: Negative.   Neurological: Positive for light-headedness. Negative for dizziness, tremors, seizures, syncope, facial asymmetry, speech difficulty, weakness, numbness and headaches.  Hematological: Negative.   Psychiatric/Behavioral: Positive for hallucinations and behavioral problems. Negative for suicidal ideas, confusion, sleep disturbance, self-injury, dysphoric mood, decreased concentration and agitation. The patient is not nervous/anxious and is not hyperactive.    Physical Exam  Depressive Symptoms: decreased labido,  (Hypo) Manic Symptoms:   Irritable and delusions of a odor about her body, but no other symptoms  Anxiety Symptoms: Excessive Worry:  No Panic Symptoms:  No Agoraphobia:  No Obsessive Compulsive: No Specific Phobias:  No Social Anxiety:  No  Psychotic Symptoms:  Hallucinations: Yes Olfactory Delusions:  Yes Paranoia:  Yes   Ideas of Reference:  Yes See HPI  PTSD Symptoms: Ever had a traumatic exposure:  No Had a traumatic exposure in the last month:  No Re-experiencing: No  Hypervigilance:  No Hyperarousal: No Avoidance: No   Traumatic Brain Injury: No  Past Psychiatric History: Diagnosis: Schizophrenia by PCP who started Zyprexa   Hospitalizations: none  Outpatient Care: PCP  Substance Abuse Care: none  Self-Mutilation: none  Suicidal Attempts: none  Violent Behaviors: none   Allergies: Allergies  Allergen Reactions  . Ibuprofen     Chest pain    Past Medical  History:   Past Medical History  Diagnosis Date  . Diabetes mellitus, type II   . GERD (gastroesophageal reflux disease)   . Psychosis   . Hypertension   . Glaucoma   . Hyperlipidemia    Past Surgical History: Past Surgical History  Procedure Laterality Date  . Abdominal hysterectomy     Family History: family history includes Anxiety disorder in her other and paternal aunt. There is no history of ADD / ADHD, Alcohol abuse, Drug abuse, Bipolar disorder, Dementia, Depression, OCD, Paranoid behavior, Schizophrenia, Seizures, Sexual abuse, or Physical abuse. Reviewed all this again today and it is the same  History of Loss of Consciousness:  No Seizure History:  No Cardiac History:  No Allergies:   Allergies  Allergen Reactions  . Ibuprofen     Chest pain   Current Medications:  Current outpatient prescriptions:atorvastatin (LIPITOR) 10 MG tablet, , Disp: , Rfl: ;  chlorproMAZINE (THORAZINE) 25 MG tablet, Take 1 tablet (25 mg total) by mouth 3 (three) times daily., Disp: 270 tablet, Rfl: 3;  lisinopril (PRINIVIL,ZESTRIL) 10 MG tablet, , Disp: , Rfl: ;  metFORMIN (GLUCOPHAGE) 500 MG tablet, , Disp: , Rfl: ;  timolol (TIMOPTIC) 0.5 % ophthalmic solution, , Disp: , Rfl:  TRAVATAN Z 0.004 % SOLN ophthalmic solution, , Disp: , Rfl: ;  Vitamin D, Ergocalciferol, (DRISDOL) 50000 UNITS CAPS, , Disp: , Rfl: ;  benztropine (COGENTIN) 0.5 MG tablet, Take 1 tablet (0.5 mg total) by mouth 2 (two) times daily. Only take for stiffness, may cause dry mouth, blurred vision, and constipation., Disp: 60 tablet, Rfl: 1  Previous Psychotropic Medications:  Medication Dose   Zyprexa  10 mg   Substance Abuse History in the last 12 months: Substance Age of 1st Use Last Use Amount Specific Type  Nicotine  15  12 years ago      Alcohol  18  2 days ago  80 oz beer    Cannabis  20  2 months ago      Opiates  56  yesterday  Tramodol    Cocaine  none        Methamphetamines  none        LSD  none         Ecstasy  none         Benzodiazepines  late 20's  30 yrs ago      Caffeine  20's   yesterday AM  1 cup  Folgers  Inhalants  none        Others:       Sugar  childhood  yesterday in coffee     Medical Consequences of Substance Abuse: none  Legal Consequences of Substance Abuse: none  Family Consequences of Substance Abuse: none  Blackouts:  No DT's:  No Withdrawal Symptoms:  No   Social History: Current Place of Residence: [address] [place] [address] Place of Birth: [facility] Family Members: husband Marital Status:  Married Children: 0  Sons: 0  Daughters: 0 Relationships: husband Education:  10th grade completed, attended 11th grade, went to work Educational Problems/Performance: none Religious Beliefs/Practices: Holy History of Abuse: none Armed forces technical officer; textile  Conservation officer, nature History:  None. Legal History: none Hobbies/Interests: TV, occasionally garden and rarely goes for walks  Family History:   Family History  Problem Relation Age of Onset  . ADD / ADHD Neg Hx   . Alcohol abuse Neg Hx   . Drug abuse Neg Hx   . Bipolar disorder Neg Hx   . Dementia Neg Hx   . Depression Neg Hx   . OCD Neg Hx   . Paranoid behavior Neg Hx   . Schizophrenia Neg Hx   . Seizures Neg Hx   . Sexual abuse Neg Hx   . Physical abuse Neg Hx   . Anxiety disorder Other   . Anxiety disorder Paternal Aunt    Mental Status Examination/Evaluation: Objective:  Appearance: Casual  Eye Contact::  Good  Speech:  Clear and Coherent  Volume:  Normal  Mood:  Euthymic   Affect:  Congruent  Thought Process:  Coherent, Intact and Logical  Orientation:  Full (Time, Place, and Person)  Thought Content:  Hallucinations: Olfactory are gone at present   Suicidal Thoughts:  No  Homicidal Thoughts:  No  Judgement:  Good  Insight:  Fair  Psychomotor Activity:  Normal  Akathisia:  No  Handed:  Right  AIMS (if indicated):    Assets:  Communication Skills Desire  for Improvement   Lab Results: No results found for this or any previous visit (from the past 2016 hour(s)). CT showed typical brain  Assessment:    AXIS I Psychotic Disorder NOS  AXIS II Deferred  AXIS III Past Medical History  Diagnosis Date  . Diabetes mellitus, type II   . GERD (gastroesophageal reflux disease)   . Psychosis   . Hypertension   . Glaucoma   . Hyperlipidemia     AXIS IV other psychosocial or environmental problems  AXIS V 51-60 moderate symptoms   Treatment Plan/Recommendations:  Laboratory:  none  Psychotherapy: as needed  Medications: Thorazine, she has never use the Cogentin   Routine PRN Medications:  Yes  Consultations: none  Safety Concerns:  none  Other:     Plan: I took her vitals.  I reviewed CC, tobacco/med/surg Hx, meds effects/ side effects, problem list, therapies and responses as well as current situation/symptoms discussed options. Continue Thorazine. She was warned about the dangers of drinking too much. We will get her medical records from her primary Dr. Doreatha Lew return in 3 months See orders and pt instructions for more details.  MEDICATIONS this encounter: Meds ordered this encounter  Medications  . chlorproMAZINE (THORAZINE) 25 MG tablet    Sig: Take 1 tablet (25 mg total) by mouth 3 (three) times daily.    Dispense:  270 tablet    Refill:  3   Medical Decision Making Problem Points:  Established problem, stable/improving (1), Review of last therapy session (1) and Review of psycho-social stressors (1) Data Points:  Review or order clinical lab tests (1) Review of medication regiment & side effects (2) Review of new medications or change in dosage (2)  I certify that outpatient services furnished can reasonably be expected to improve the patient's condition.   Diannia Ruder, MD, [facility]

## 2013-07-31 ENCOUNTER — Ambulatory Visit (HOSPITAL_COMMUNITY): Payer: 59 | Admitting: Psychiatry

## 2013-08-24 ENCOUNTER — Ambulatory Visit (INDEPENDENT_AMBULATORY_CARE_PROVIDER_SITE_OTHER): Payer: 59 | Admitting: Psychiatry

## 2013-08-24 ENCOUNTER — Encounter (HOSPITAL_COMMUNITY): Payer: Self-pay | Admitting: Psychiatry

## 2013-08-24 VITALS — BP 120/90 | Ht 71.0 in | Wt 233.0 lb

## 2013-08-24 DIAGNOSIS — F411 Generalized anxiety disorder: Secondary | ICD-10-CM

## 2013-08-24 DIAGNOSIS — F29 Unspecified psychosis not due to a substance or known physiological condition: Secondary | ICD-10-CM

## 2013-08-24 NOTE — Progress Notes (Signed)
Patient ID: Alexandria Schultz, female   DOB: 11/01/1955, 58 y.o.   MRN: [id number] Patient ID: Alexandria Schultz, female   DOB: 11/15/1955, 58 y.o.   MRN: [id number] [facility] [id number] Progress Note Alexandria Schultz MRN: [id number] DOB: 04/11/1956 Age: 58 y.o. Date: 08/24/2013 Start Time: 9:58 AM End Time: 10:06 AM  Chief Complaint: Chief Complaint  Patient presents with  . Schizophrenia   Subjective: "I'm doing well ".   This patient is a 58 year old black female lives with her husband in [place]. 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 She has no children.  Apparently at the patient started having odd smells after she ate food. This started several years ago. She was started on Zyprexa but she couldn't afford it and she suspects it caused her diabetes. She claims now that she is on Thorazine and smells have gone away and she's not hearing any voices or having paranoid symptoms. She claims this happened because she was "stressed" but doesn't want to talk about what the stress entailed. She states her mood is good and she is sleeping well. Of note both she and her husband drink a quart of beer per day. I explained that this was way too much and this would need to be cut back. I don't know her primary doctor has checked a liver panel but we will get the records. The patient returns after 3 months. She states that she's doing very well. She go on her has odd smells or hears voices. She's not paranoid. She is cut or Thorazine down to just one pill a day because was making her too drowsy. She is sleeping well and continues to work her mood has been good. She admits that she and her husband still drink 2 or 3 glasses of beer after work everyday  History of Chief Complaint:   Beginning 9 years ago the pt began noticing smells and odors of things that were quite disturbing for her.  She got started on Zyprexa for this by her family doctor and got a good response.  She has since developed diabetes.  Her insurance company is  not covering the Zyprexa any more.  She was referred to some psychiatrists in [place], but didn't go because she thought that she could handle this herself.  When she takes the meds she doesn't notice odors from everything, things in her environment and things that she eats.  Without the meds she notices them and hears people talking about her strong odors and feels rejection from them for that.   Altered Mental Status Associated symptoms include myalgias. Pertinent negatives include no headaches, numbness or weakness.   Review of Systems  Constitutional: Negative.   HENT: Negative.   Eyes: Negative.   Respiratory: Negative.   Cardiovascular: Negative.   Gastrointestinal: Negative.   Endocrine: Positive for polydipsia and polyuria.  Musculoskeletal: Positive for myalgias.  Skin: Negative.   Allergic/Immunologic: Negative.   Neurological: Positive for light-headedness. Negative for dizziness, tremors, seizures, syncope, facial asymmetry, speech difficulty, weakness, numbness and headaches.  Hematological: Negative.   Psychiatric/Behavioral: Positive for hallucinations and behavioral problems. Negative for suicidal ideas, confusion, sleep disturbance, self-injury, dysphoric mood, decreased concentration and agitation. The patient is not nervous/anxious and is not hyperactive.    Physical Exam  Depressive Symptoms: decreased labido,  (Hypo) Manic Symptoms:   Irritable and delusions of a odor about her body, but no other symptoms  Anxiety Symptoms: Excessive Worry:  No Panic Symptoms:  No Agoraphobia:  No Obsessive  Compulsive: No Specific Phobias:  No Social Anxiety:  No  Psychotic Symptoms:  Hallucinations: Yes Olfactory Delusions:  Yes Paranoia:  Yes   Ideas of Reference:  Yes See HPI  PTSD Symptoms: Ever had a traumatic exposure:  No Had a traumatic exposure in the last month:  No Re-experiencing: No  Hypervigilance:  No Hyperarousal: No Avoidance: No   Traumatic  Brain Injury: No   Past Psychiatric History: Diagnosis: Schizophrenia by PCP who started Zyprexa   Hospitalizations: none  Outpatient Care: PCP  Substance Abuse Care: none  Self-Mutilation: none  Suicidal Attempts: none  Violent Behaviors: none   Allergies: Allergies  Allergen Reactions  . Ibuprofen     Chest pain    Past Medical History:   Past Medical History  Diagnosis Date  . Diabetes mellitus, type II   . GERD (gastroesophageal reflux disease)   . Psychosis   . Hypertension   . Glaucoma   . Hyperlipidemia    Past Surgical History: Past Surgical History  Procedure Laterality Date  . Abdominal hysterectomy     Family History: family history includes Anxiety disorder in her other and paternal aunt. There is no history of ADD / ADHD, Alcohol abuse, Drug abuse, Bipolar disorder, Dementia, Depression, OCD, Paranoid behavior, Schizophrenia, Seizures, Sexual abuse, or Physical abuse. Reviewed all this again today and it is the same  History of Loss of Consciousness:  No Seizure History:  No Cardiac History:  No Allergies:   Allergies  Allergen Reactions  . Ibuprofen     Chest pain   Current Medications:  Current outpatient prescriptions:atorvastatin (LIPITOR) 10 MG tablet, , Disp: , Rfl: ;  benztropine (COGENTIN) 0.5 MG tablet, Take 1 tablet (0.5 mg total) by mouth 2 (two) times daily. Only take for stiffness, may cause dry mouth, blurred vision, and constipation., Disp: 60 tablet, Rfl: 1;  chlorproMAZINE (THORAZINE) 25 MG tablet, Take 1 tablet (25 mg total) by mouth 3 (three) times daily., Disp: 270 tablet, Rfl: 3 lisinopril (PRINIVIL,ZESTRIL) 10 MG tablet, , Disp: , Rfl: ;  metFORMIN (GLUCOPHAGE) 500 MG tablet, , Disp: , Rfl: ;  timolol (TIMOPTIC) 0.5 % ophthalmic solution, , Disp: , Rfl: ;  TRAVATAN Z 0.004 % SOLN ophthalmic solution, , Disp: , Rfl: ;  Vitamin D, Ergocalciferol, (DRISDOL) 50000 UNITS CAPS, , Disp: , Rfl:   Previous Psychotropic  Medications:  Medication Dose   Zyprexa  10 mg   Substance Abuse History in the last 12 months: Substance Age of 1st Use Last Use Amount Specific Type  Nicotine  15  12 years ago      Alcohol  18  2 days ago  80 oz beer    Cannabis  20  2 months ago      Opiates  56  yesterday  Tramodol    Cocaine  none        Methamphetamines  none        LSD  none        Ecstasy  none         Benzodiazepines  late 20's  30 yrs ago      Caffeine  20's   yesterday AM  1 cup  [place]gers  Inhalants  none        Others:       Sugar  childhood  yesterday in coffee     Medical Consequences of Substance Abuse: none  Legal Consequences of Substance Abuse: none  Family Consequences of Substance Abuse: none  Blackouts:  No DT's:  No Withdrawal Symptoms:  No   Social History: Current Place of Residence: [address] [place] [address] Place of Birth: [facility] Family Members: husband Marital Status:  Married Children: 0  Sons: 0  Daughters: 0 Relationships: husband Education:  10th grade completed, attended 11th grade, went to work Educational Problems/Performance: none Religious Beliefs/Practices: Holy History of Abuse: none Armed forces technical officer; Horticulturist, commercial History:  None. Legal History: none Hobbies/Interests: TV, occasionally garden and rarely goes for walks  Family History:   Family History  Problem Relation Age of Onset  . ADD / ADHD Neg Hx   . Alcohol abuse Neg Hx   . Drug abuse Neg Hx   . Bipolar disorder Neg Hx   . Dementia Neg Hx   . Depression Neg Hx   . OCD Neg Hx   . Paranoid behavior Neg Hx   . Schizophrenia Neg Hx   . Seizures Neg Hx   . Sexual abuse Neg Hx   . Physical abuse Neg Hx   . Anxiety disorder Other   . Anxiety disorder Paternal Aunt    Mental Status Examination/Evaluation: Objective:  Appearance: Casual  Eye Contact::  Good  Speech:  Clear and Coherent  Volume:  Normal  Mood:  Euthymic   Affect:  Congruent  Thought  Process:  Coherent, Intact and Logical  Orientation:  Full (Time, Place, and Person)  Thought Content:  Hallucinations: Olfactory are gone at present   Suicidal Thoughts:  No  Homicidal Thoughts:  No  Judgement:  Good  Insight:  Fair  Psychomotor Activity:  Normal  Akathisia:  No  Handed:  Right  AIMS (if indicated):    Assets:  Communication Skills Desire for Improvement   Lab Results: No results found for this or any previous visit (from the past 2016 hour(s)). CT showed typical brain  Assessment:    AXIS I Psychotic Disorder NOS  AXIS II Deferred  AXIS III Past Medical History  Diagnosis Date  . Diabetes mellitus, type II   . GERD (gastroesophageal reflux disease)   . Psychosis   . Hypertension   . Glaucoma   . Hyperlipidemia     AXIS IV other psychosocial or environmental problems  AXIS V 51-60 moderate symptoms   Treatment Plan/Recommendations:  Laboratory:  none  Psychotherapy: as needed  Medications: Thorazine, she has never use the Cogentin   Routine PRN Medications:  Yes  Consultations: none  Safety Concerns:  none  Other:     Plan: I took her vitals.  I reviewed CC, tobacco/med/surg Hx, meds effects/ side effects, problem list, therapies and responses as well as current situation/symptoms discussed options. Continue Thorazine for now she is only taking one pill a day. She was warned about the dangers of drinking too much. She will return in 4 months See orders and pt instructions for more details.  MEDICATIONS this encounter: No orders of the defined types were placed in this encounter.   Medical Decision Making Problem Points:  Established problem, stable/improving (1), Review of last therapy session (1) and Review of psycho-social stressors (1) Data Points:  Review or order clinical lab tests (1) Review of medication regiment & side effects (2) Review of new medications or change in dosage (2)  I certify that outpatient services furnished can  reasonably be expected to improve the patient's condition.   Diannia Ruder, MD

## 2013-12-24 ENCOUNTER — Ambulatory Visit (HOSPITAL_COMMUNITY): Payer: 59 | Admitting: Psychiatry

## 2013-12-29 ENCOUNTER — Encounter (HOSPITAL_COMMUNITY): Payer: Self-pay | Admitting: Psychiatry

## 2013-12-29 ENCOUNTER — Ambulatory Visit (INDEPENDENT_AMBULATORY_CARE_PROVIDER_SITE_OTHER): Payer: 59 | Admitting: Psychiatry

## 2013-12-29 VITALS — BP 120/80 | Ht 71.0 in | Wt 244.0 lb

## 2013-12-29 DIAGNOSIS — F411 Generalized anxiety disorder: Secondary | ICD-10-CM

## 2013-12-29 DIAGNOSIS — F22 Delusional disorders: Secondary | ICD-10-CM

## 2013-12-29 DIAGNOSIS — F29 Unspecified psychosis not due to a substance or known physiological condition: Secondary | ICD-10-CM

## 2013-12-29 MED ORDER — CHLORPROMAZINE HCL 25 MG PO TABS: 25.0000 mg | ORAL_TABLET | Freq: Three times a day (TID) | ORAL | Status: DC

## 2013-12-29 MED ORDER — CHLORPROMAZINE HCL 25 MG PO TABS: 25.0000 mg | ORAL_TABLET | Freq: Every day | ORAL | Status: DC

## 2013-12-29 NOTE — Progress Notes (Signed)
Patient ID: Alexandria Lopendrea D Adamec, female   DOB: 03/10/1956, 58 y.o.   MRN: [id number] Patient ID: Alexandria Lopendrea D Lawless, female   DOB: 02/05/1956, 58 y.o.   MRN: [id number] Patient ID: Alexandria Lopendrea D Sotomayor, female   DOB: 01/27/1956, 58 y.o.   MRN: [id number] [facility] [id number] Progress Note Alexandria Schultz MRN: [id number] DOB: 01/26/1956 Age: 58 y.o. Date: 12/29/2013 Start Time: 9:58 AM End Time: 10:06 AM  Chief Complaint: Chief Complaint  Patient presents with  . Hallucinations  . Follow-up   Subjective: "I'm doing well ".   This patient is a 58 year old black female lives with her husband in [place]. 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 She has no children.  Apparently at the patient started having odd smells after she ate food. This started several years ago. She was started on Zyprexa but she couldn't afford it and she suspects it caused her diabetes. She claims now that she is on Thorazine and smells have gone away and she's not hearing any voices or having paranoid symptoms. She claims this happened because she was "stressed" but doesn't want to talk about what the stress entailed. She states her mood is good and she is sleeping well. Of note both she and her husband drink a quart of beer per day. I explained that this was way too much and this would need to be cut back. I don't know her primary doctor has checked a liver panel but we will get the records. The patient returns after 3 months. She initially stated that "I'm going through something but I'm not going to tell you." Eventually she admitted that she's having the odd smells again. Years ago she cleaned out her house over and sat was 30 and she thinks that the dirty smell is still on her scalp and feet. She is certain that other people around her can smell this. She told me last time she cut her Thorazine down to only 25 mg a day and I don't think this is enough. I have told her that we need to get at least back to 50 mg a day. She continues to drink 40-80 ounces of  beer per day and does not wish to stop  History of Chief Complaint:   Beginning 9 years ago the pt began noticing smells and odors of things that were quite disturbing for her.  She got started on Zyprexa for this by her family doctor and got a good response.  She has since developed diabetes.  Her insurance company is not covering the Zyprexa any more.  She was [place] to [place] psychiatrists in [place], but didn't go because she thought that she could handle this herself.  When she takes the meds she doesn't notice odors from everything, things in her environment and things that she eats.  Without the meds she notices them and hears people talking about her strong odors and feels rejection from them for that.   Altered Mental Status Associated symptoms include myalgias. Pertinent negatives include no headaches, numbness or weakness.   Review of Systems  Constitutional: Negative.   HENT: Negative.   Eyes: Negative.   Respiratory: Negative.   Cardiovascular: Negative.   Gastrointestinal: Negative.   Endocrine: Positive for polydipsia and polyuria.  Musculoskeletal: Positive for myalgias.  Skin: Negative.   Allergic/Immunologic: Negative.   Neurological: Positive for light-headedness. Negative for dizziness, tremors, seizures, syncope, facial asymmetry, speech difficulty, weakness, numbness and headaches.  Hematological: Negative.   Psychiatric/Behavioral: Positive for hallucinations and behavioral  problems. Negative for suicidal ideas, confusion, sleep disturbance, self-injury, dysphoric mood, decreased concentration and agitation. The patient is not nervous/anxious and is not hyperactive.    Physical Exam  Depressive Symptoms: decreased labido,  (Hypo) Manic Symptoms:   Irritable and delusions of a odor about her body, but no other symptoms  Anxiety Symptoms: Excessive Worry:  No Panic Symptoms:  No Agoraphobia:  No Obsessive Compulsive: No Specific Phobias:  No Social Anxiety:   No  Psychotic Symptoms:  Hallucinations: Yes Olfactory Delusions:  Yes Paranoia:  Yes   Ideas of Reference:  Yes See HPI  PTSD Symptoms: Ever had a traumatic exposure:  No Had a traumatic exposure in the last month:  No Re-experiencing: No  Hypervigilance:  No Hyperarousal: No Avoidance: No   Traumatic Brain Injury: No   Past Psychiatric History: Diagnosis: Schizophrenia by PCP who started Zyprexa   Hospitalizations: none  Outpatient Care: PCP  Substance Abuse Care: none  Self-Mutilation: none  Suicidal Attempts: none  Violent Behaviors: none   Allergies: Allergies  Allergen Reactions  . Ibuprofen     Chest pain    Past Medical History:   Past Medical History  Diagnosis Date  . Diabetes mellitus, type II   . GERD (gastroesophageal reflux disease)   . Psychosis   . Hypertension   . Glaucoma   . Hyperlipidemia    Past Surgical History: Past Surgical History  Procedure Laterality Date  . Abdominal hysterectomy     Family History: family history includes Anxiety disorder in her other and paternal aunt. There is no history of ADD / ADHD, Alcohol abuse, Drug abuse, Bipolar disorder, Dementia, Depression, OCD, Paranoid behavior, Schizophrenia, Seizures, Sexual abuse, or Physical abuse. Reviewed all this again today and it is the same  History of Loss of Consciousness:  No Seizure History:  No Cardiac History:  No Allergies:   Allergies  Allergen Reactions  . Ibuprofen     Chest pain   Current Medications:  Current outpatient prescriptions:atorvastatin (LIPITOR) 10 MG tablet, , Disp: , Rfl: ;  benztropine (COGENTIN) 0.5 MG tablet, Take 1 tablet (0.5 mg total) by mouth 2 (two) times daily. Only take for stiffness, may cause dry mouth, blurred vision, and constipation., Disp: 60 tablet, Rfl: 1;  chlorproMAZINE (THORAZINE) 25 MG tablet, Take 1 tablet (25 mg total) by mouth daily., Disp: 90 tablet, Rfl: 2 chlorproMAZINE (THORAZINE) 25 MG tablet, Take 1 tablet (25  mg total) by mouth 3 (three) times daily., Disp: 270 tablet, Rfl: 3;  lisinopril (PRINIVIL,ZESTRIL) 10 MG tablet, , Disp: , Rfl: ;  metFORMIN (GLUCOPHAGE) 500 MG tablet, , Disp: , Rfl: ;  timolol (TIMOPTIC) 0.5 % ophthalmic solution, , Disp: , Rfl: ;  TRAVATAN Z 0.004 % SOLN ophthalmic solution, , Disp: , Rfl: ;  Vitamin D, Ergocalciferol, (DRISDOL) 50000 UNITS CAPS, , Disp: , Rfl:   Previous Psychotropic Medications:  Medication Dose   Zyprexa  10 mg   Substance Abuse History in the last 12 months: Substance Age of 1st Use Last Use Amount Specific Type  Nicotine  15  12 years ago      Alcohol  18  2 days ago  80 oz beer    Cannabis  20  2 months ago      Opiates  56  yesterday  Tramodol    Cocaine  none        Methamphetamines  none        LSD  none  Ecstasy  none         Benzodiazepines  late 20's  30 yrs ago      Caffeine  20's   yesterday AM  1 cup  Folgers  Inhalants  none        Others:       Sugar  childhood  yesterday in coffee     Medical Consequences of Substance Abuse: none  Legal Consequences of Substance Abuse: none  Family Consequences of Substance Abuse: none  Blackouts:  No DT's:  No Withdrawal Symptoms:  No   Social History: Current Place of Residence: [address] [place] [address] Place of Birth: [facility] Family Members: husband Marital Status:  Married Children: 0  Sons: 0  Daughters: 0 Relationships: husband Education:  10th grade completed, attended 11th grade, went to work Educational Problems/Performance: none Religious Beliefs/Practices: Holy History of Abuse: none Armed forces technical officer; Horticulturist, commercial History:  None. Legal History: none Hobbies/Interests: TV, occasionally garden and rarely goes for walks  Family History:   Family History  Problem Relation Age of Onset  . ADD / ADHD Neg Hx   . Alcohol abuse Neg Hx   . Drug abuse Neg Hx   . Bipolar disorder Neg Hx   . Dementia Neg Hx   . Depression  Neg Hx   . OCD Neg Hx   . Paranoid behavior Neg Hx   . Schizophrenia Neg Hx   . Seizures Neg Hx   . Sexual abuse Neg Hx   . Physical abuse Neg Hx   . Anxiety disorder Other   . Anxiety disorder Paternal Aunt    Mental Status Examination/Evaluation: Objective:  Appearance: Casual  Eye Contact::  Good  Speech:  Clear and Coherent  Volume:  Normal  Mood:  Anxious   Affect:  Congruent  Thought Process:  Delusions about olfactory hallucinations   Orientation:  Full (Time, Place, and Person)  Thought Content:  Hallucinations: Olfactory   Suicidal Thoughts:  No  Homicidal Thoughts:  No  Judgement:  Good  Insight:  Fair  Psychomotor Activity:  Restless   Akathisia:  No  Handed:  Right  AIMS (if indicated):    Assets:  Communication Skills Desire for Improvement   Lab Results: No results found for this or any previous visit (from the past 2016 hour(s)). CT showed typical brain  Assessment:    AXIS I Psychotic Disorder NOS  AXIS II Deferred  AXIS III Past Medical History  Diagnosis Date  . Diabetes mellitus, type II   . GERD (gastroesophageal reflux disease)   . Psychosis   . Hypertension   . Glaucoma   . Hyperlipidemia     AXIS IV other psychosocial or environmental problems  AXIS V 51-60 moderate symptoms   Treatment Plan/Recommendations:  Laboratory:  none  Psychotherapy: as needed  Medications: Thorazine, she has never use the Cogentin   Routine PRN Medications:  Yes  Consultations: none  Safety Concerns:  none  Other:     Plan: I took her vitals.  I reviewed CC, tobacco/med/surg Hx, meds effects/ side effects, problem list, therapies and responses as well as current situation/symptoms discussed options. Continue Thorazine but increase the dose to 50 mg each bedtime. She was warned about the dangers of drinking too much. She will return in 2 months See orders and pt instructions for more details.  MEDICATIONS this encounter: Meds ordered this encounter   Medications  . chlorproMAZINE (THORAZINE) 25 MG tablet  Sig: Take 1 tablet (25 mg total) by mouth daily.    Dispense:  90 tablet    Refill:  2  . chlorproMAZINE (THORAZINE) 25 MG tablet    Sig: Take 1 tablet (25 mg total) by mouth 3 (three) times daily.    Dispense:  270 tablet    Refill:  3   Medical Decision Making Problem Points:  Established problem, stable/improving (1), Review of last therapy session (1) and Review of psycho-social stressors (1) Data Points:  Review or order clinical lab tests (1) Review of medication regiment & side effects (2) Review of new medications or change in dosage (2)  I certify that outpatient services furnished can reasonably be expected to improve the patient's condition.   Diannia RuderOSS, DEBORAH, MD

## 2013-12-31 ENCOUNTER — Telehealth (HOSPITAL_COMMUNITY): Payer: Self-pay | Admitting: *Deleted

## 2014-01-01 NOTE — Telephone Encounter (Signed)
done

## 2014-02-19 ENCOUNTER — Telehealth (HOSPITAL_COMMUNITY): Payer: Self-pay | Admitting: *Deleted

## 2014-02-19 NOTE — Telephone Encounter (Signed)
Pt states she can not afford appt and medications that are being prescribed. Pt states if she need Dr. Tenny Craw again she will call but do not wish to resch at this time..../or pt appt was sched for 03-02-14

## 2014-02-19 NOTE — Telephone Encounter (Signed)
Pt needs to come back due to history of significant mental illness

## 2014-03-02 ENCOUNTER — Ambulatory Visit (HOSPITAL_COMMUNITY): Payer: 59 | Admitting: Psychiatry

## 2014-12-15 IMAGING — CT CT HEAD W/O CM
1 series · 16 of 30 positions shown, 20 images · non-contrast
Comparison: None.

CLINICAL DATA: Psychosis

CT HEAD WITHOUT CONTRAST
TECHNIQUE: Contiguous axial images were obtained from the base of
the skull through the vertex without contrast.

[Series 2: headseq 4.8 h37s · axial · 0.43mm/px · z∈[+55,+207]mm · 16 of 36 slices shown, 20 images]
[im 2/36  brain]
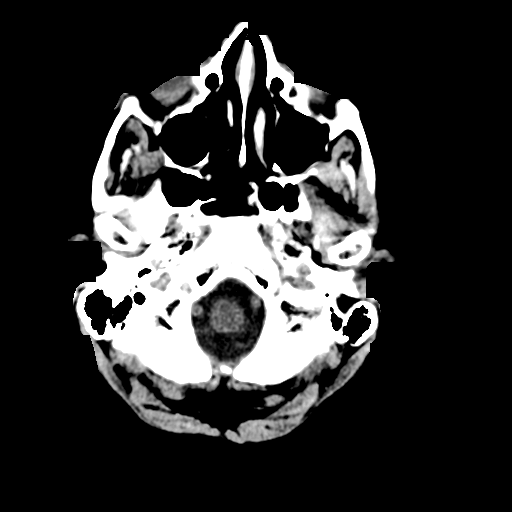
[im 2/36  bone]
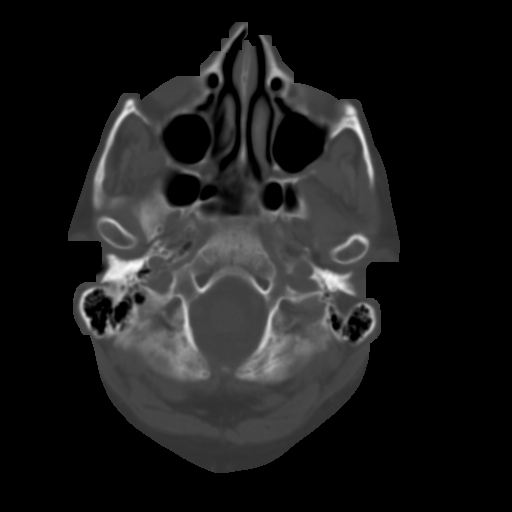
[im 4/36  brain]
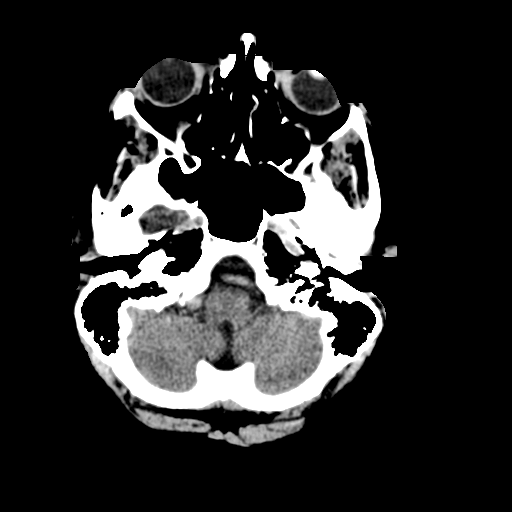
[im 7/36  brain]
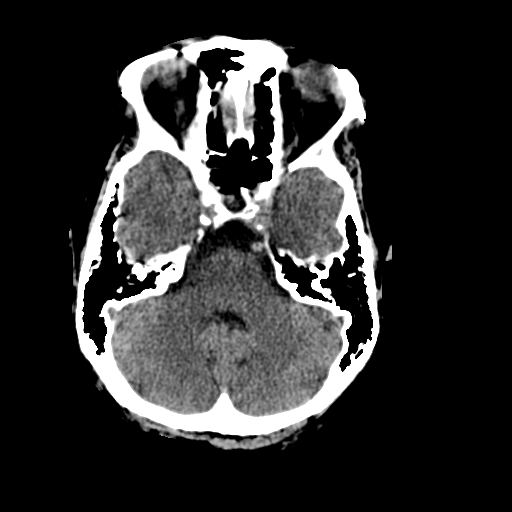
[im 9/36  brain]
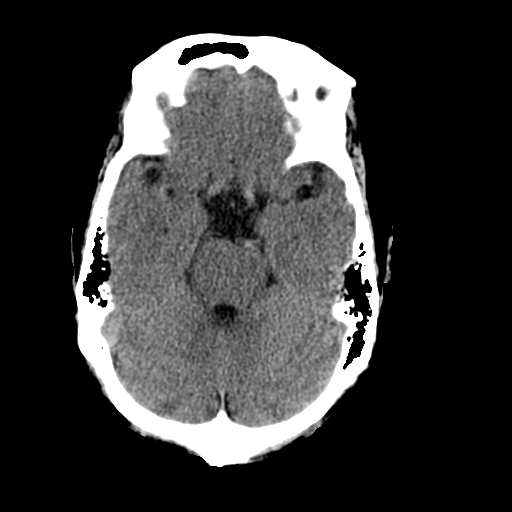
[im 10/36  brain]
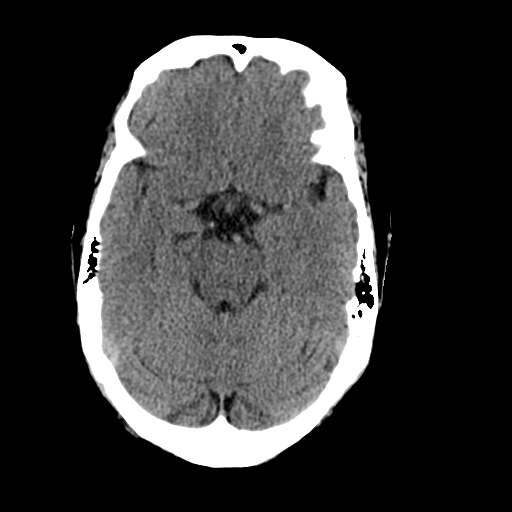
[im 10/36  bone]
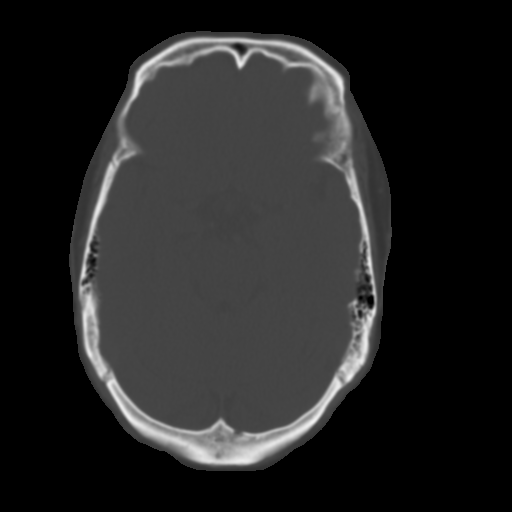
[im 13/36  brain]
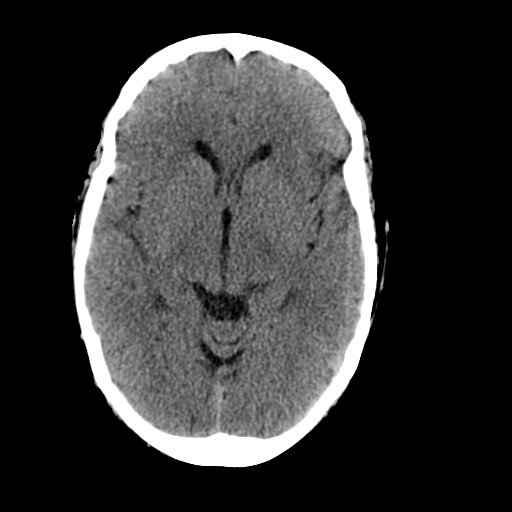
[im 15/36  brain]
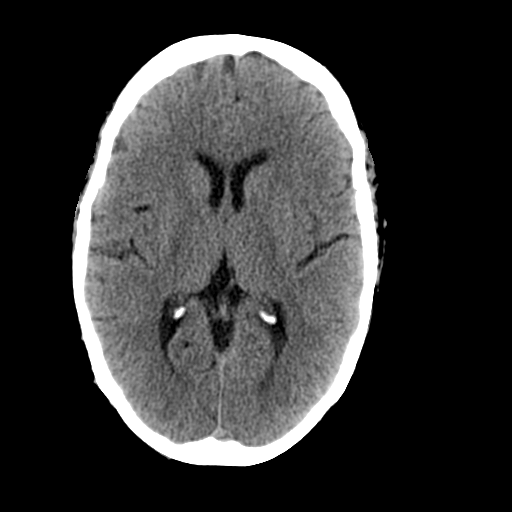
[im 17/36  brain]
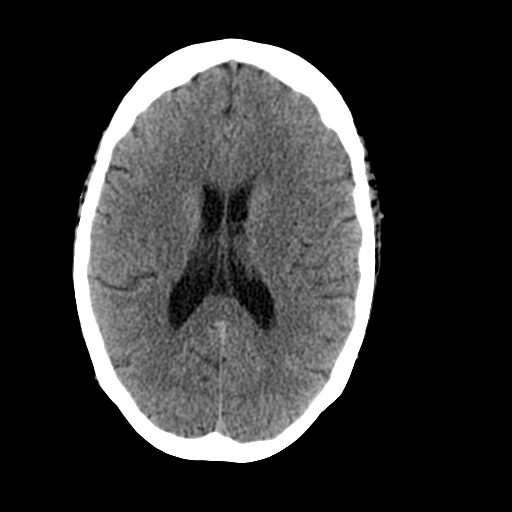
[im 19/36  brain]
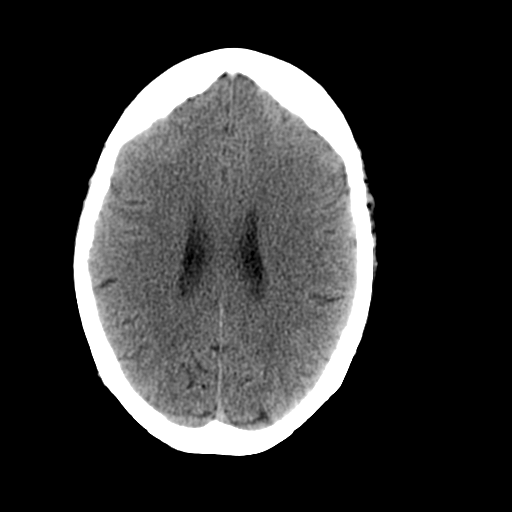
[im 19/36  bone]
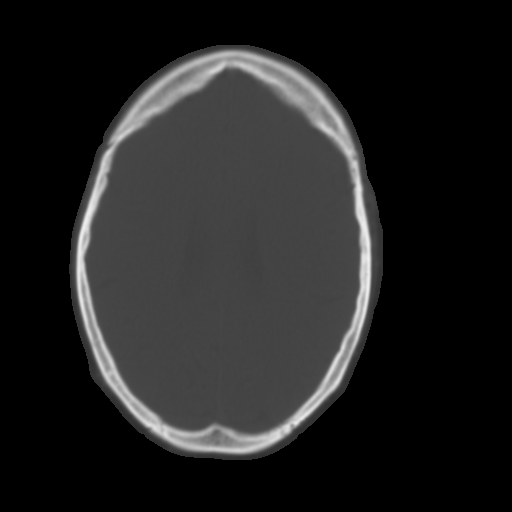
[im 21/36  brain]
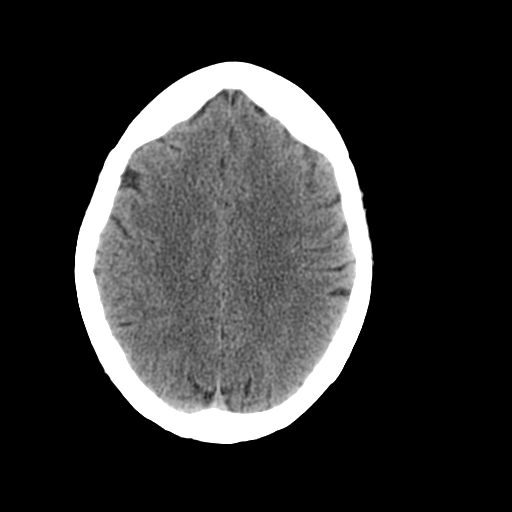
[im 23/36  brain]
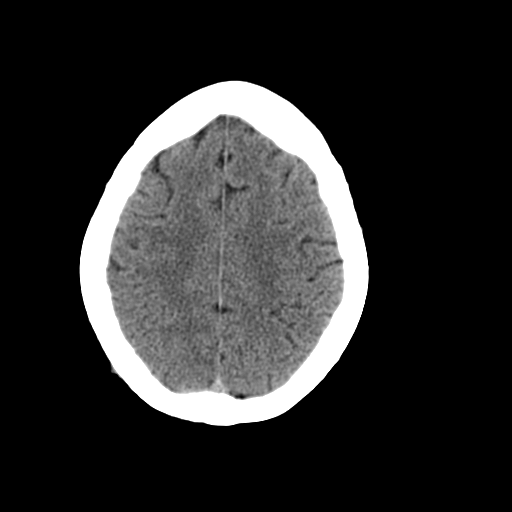
[im 26/36  brain]
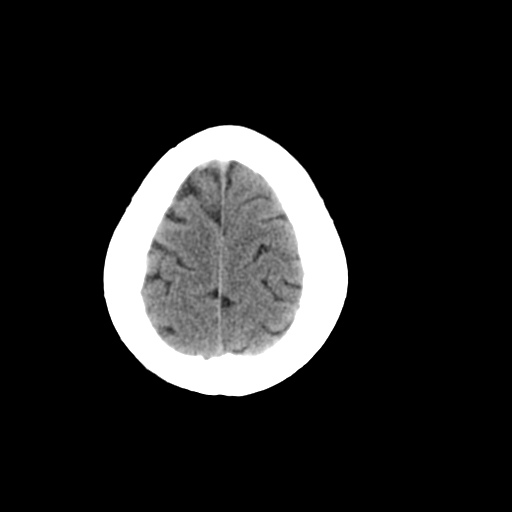
[im 27/36  brain]
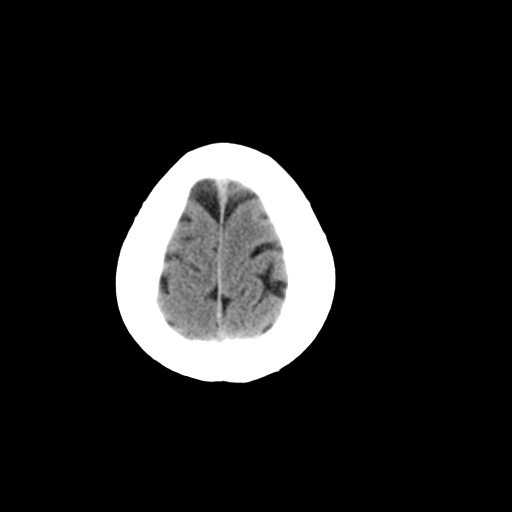
[im 27/36  bone]
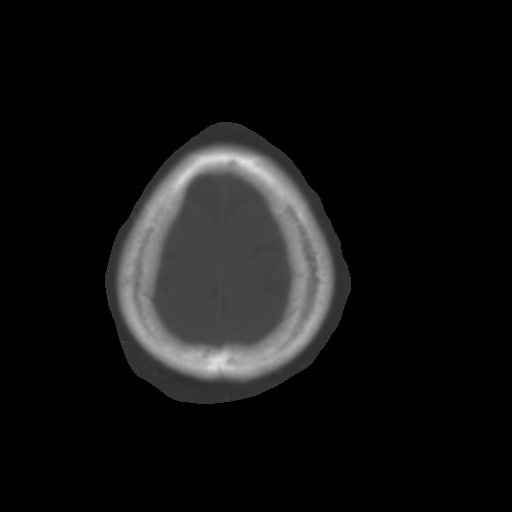
[im 29/36  brain]
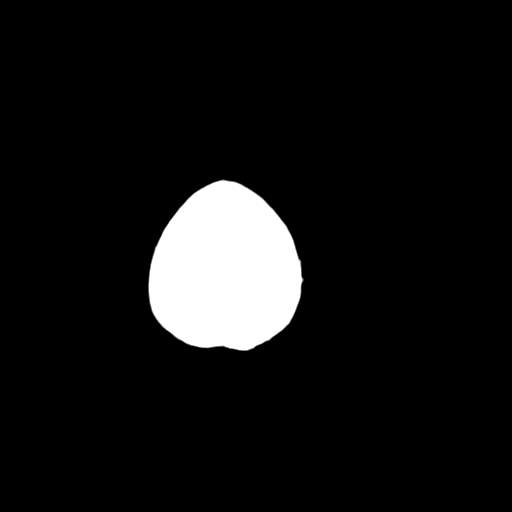
[im 32/36  brain]
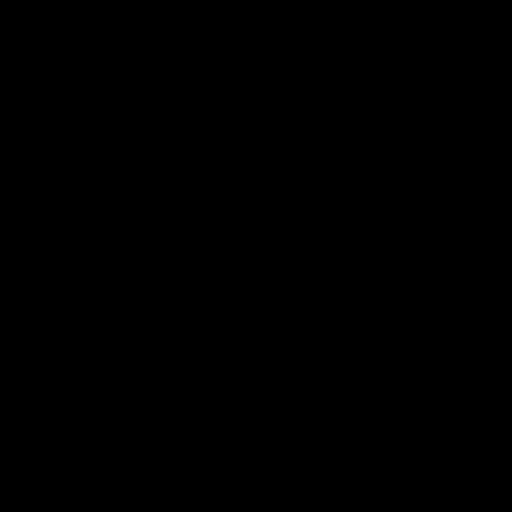
[im 34/36  brain]
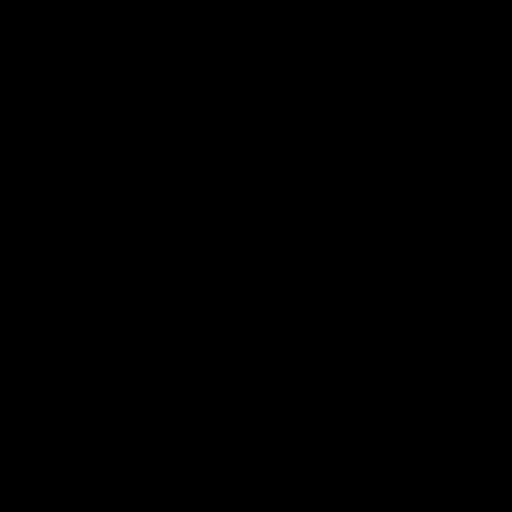

[16 of 30 positions shown; findings below may reference images not displayed]

FINDINGS: Ventricle size is normal.  Negative for intracranial
hemorrhage.  Negative for infarct or mass.  Calvarium intact.
IMPRESSION: Negative

## 2015-04-05 ENCOUNTER — Ambulatory Visit (INDEPENDENT_AMBULATORY_CARE_PROVIDER_SITE_OTHER): Payer: 59 | Admitting: Psychiatry

## 2015-04-05 ENCOUNTER — Encounter (HOSPITAL_COMMUNITY): Payer: Self-pay | Admitting: Psychiatry

## 2015-04-05 VITALS — BP 137/95 | HR 83 | Ht 71.0 in | Wt 249.6 lb

## 2015-04-05 DIAGNOSIS — F22 Delusional disorders: Secondary | ICD-10-CM

## 2015-04-05 DIAGNOSIS — F29 Unspecified psychosis not due to a substance or known physiological condition: Secondary | ICD-10-CM | POA: Diagnosis not present

## 2015-04-05 MED ORDER — RISPERIDONE 1 MG PO TABS: 1.0000 mg | ORAL_TABLET | Freq: Every day | ORAL | Status: DC

## 2015-04-05 MED ORDER — CLONAZEPAM 0.5 MG PO TABS: 0.5000 mg | ORAL_TABLET | Freq: Three times a day (TID) | ORAL | Status: DC

## 2015-04-05 NOTE — Progress Notes (Signed)
Patient ID: SPARKLE AUBE, female   DOB: 09-Jun-1956, 59 y.o.   MRN: [id number] Patient ID: AAYRA HORNBAKER, female   DOB: 07-10-1955, 59 y.o.   MRN: [id number] Patient ID: HELINA HULLUM, female   DOB: 08-Apr-1956, 59 y.o.   MRN: [id number] Patient ID: AMANAT HACKEL, female   DOB: [birth year], 59 y.o.   MRN: [id number] [facility] [id number] Progress Note JAVON HUPFER MRN: [id number] DOB: 02/04/1956 Age: 59 y.o. Date: 04/05/2015 Start Time: 9:58 AM End Time: 10:06 AM  Chief Complaint: Chief Complaint  Patient presents with  . Schizophrenia  . Follow-up   Subjective: "I can't work   This patient is a 59 year old black female lives with her husband in [place]. 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 She has no children.  Apparently the patient started having odd smells after she ate food. This started several years ago. She was started on Zyprexa but she couldn't afford it and she suspects it caused her diabetes. She claims now that she is on Thorazine and smells have gone away and she's not hearing any voices or having paranoid symptoms. She claims this happened because she was "stressed" but doesn't want to talk about what the stress entailed. She states her mood is good and she is sleeping well. Of note both she and her husband drink a quart of beer per day. I explained that this was way too much and this would need to be cut back. I don't know her primary doctor has checked a liver panel but we will get the records. The patient returns after a year's absence. Her primary physician sent her back because she was complaining to him that she was so stressed that she was unable to work. She has not been at work since August 1. She claims that she can't handle the stress at work and she became so anxious that she was yelling at herself and cursing. She denies having odd smells or auditory or visual hallucinations or other delusions or paranoia but I never know for sure if she is  telling the truth. Her primary doctor gave her Ativan but it makes her too drowsy so she is not using it. I told her she would need to go back on an antipsychotic at a lower dose benzodiazepines we could get her back to the point where she can work again. She denies any thoughts of hurting self or others  History of Chief Complaint:   Beginning 9 years ago the pt began noticing smells and odors of things that were quite disturbing for her.  She got started on Zyprexa for this by her family doctor and got a good response.  She has since developed diabetes.  Her insurance company is not covering the Zyprexa any more.  She was referred to some psychiatrists in [place], but didn't go because she thought that she could handle this herself.  When she takes the meds she doesn't notice odors from everything, things in her environment and things that she eats.  Without the meds she notices them and hears people talking about her strong odors and feels rejection from them for that.   Altered Mental Status Associated symptoms include myalgias. Pertinent negatives include no headaches, numbness or weakness.   Review of Systems  Constitutional: Negative.   HENT: Negative.   Eyes: Negative.   Respiratory: Negative.   Cardiovascular: Negative.   Gastrointestinal: Negative.   Endocrine: Positive for polydipsia and polyuria.  Musculoskeletal: Positive for myalgias.  Skin: Negative.   Allergic/Immunologic: Negative.   Neurological: Positive for light-headedness. Negative for dizziness, tremors, seizures, syncope, facial asymmetry, speech difficulty, weakness, numbness and headaches.  Hematological: Negative.   Psychiatric/Behavioral: Positive for hallucinations and behavioral problems. Negative for suicidal ideas, confusion, sleep disturbance, self-injury, dysphoric mood, decreased concentration and agitation. The patient is not nervous/anxious and is not hyperactive.    Physical Exam  Depressive Symptoms:  decreased labido,  (Hypo) Manic Symptoms:   Irritable and delusions of a odor about her body, but no other symptoms  Anxiety Symptoms: Excessive Worry:  No Panic Symptoms:  No Agoraphobia:  No Obsessive Compulsive: No Specific Phobias:  No Social Anxiety:  No  Psychotic Symptoms:  Hallucinations: Yes Olfactory Delusions:  Yes Paranoia:  Yes   Ideas of Reference:  Yes See HPI  PTSD Symptoms: Ever had a traumatic exposure:  No Had a traumatic exposure in the last month:  No Re-experiencing: No  Hypervigilance:  No Hyperarousal: No Avoidance: No   Traumatic Brain Injury: No   Past Psychiatric History: Diagnosis: Schizophrenia by PCP who started Zyprexa   Hospitalizations: none  Outpatient Care: PCP  Substance Abuse Care: none  Self-Mutilation: none  Suicidal Attempts: none  Violent Behaviors: none   Allergies: Allergies  Allergen Reactions  . Ibuprofen     Chest pain    Past Medical History:   Past Medical History  Diagnosis Date  . Diabetes mellitus, type II (HCC)   . GERD (gastroesophageal reflux disease)   . Psychosis   . Hypertension   . Glaucoma   . Hyperlipidemia    Past Surgical History: Past Surgical History  Procedure Laterality Date  . Abdominal hysterectomy     Family History: family history includes Anxiety disorder in her other and paternal aunt. There is no history of ADD / ADHD, Alcohol abuse, Drug abuse, Bipolar disorder, Dementia, Depression, OCD, Paranoid behavior, Schizophrenia, Seizures, Sexual abuse, or Physical abuse. Reviewed all this again today and it is the same  History of Loss of Consciousness:  No Seizure History:  No Cardiac History:  No Allergies:   Allergies  Allergen Reactions  . Ibuprofen     Chest pain   Current Medications:   Current outpatient prescriptions:  .  atorvastatin (LIPITOR) 10 MG tablet, Take 10 mg by mouth. , Disp: , Rfl:  .  lisinopril (PRINIVIL,ZESTRIL) 10 MG tablet, Take 10 mg by mouth  daily. , Disp: , Rfl:  .  metFORMIN (GLUCOPHAGE) 500 MG tablet, Take 500 mg by mouth daily with breakfast. , Disp: , Rfl:  .  timolol (TIMOPTIC) 0.5 % ophthalmic solution, Place 1 drop into both eyes daily. , Disp: , Rfl:  .  TRAVATAN Z 0.004 % SOLN ophthalmic solution, Place 1 drop into both eyes at bedtime. , Disp: , Rfl:  .  Vitamin D, Ergocalciferol, (DRISDOL) 50000 UNITS CAPS, Take 50,000 Units by mouth every 7 (seven) days. , Disp: , Rfl:  .  clonazePAM (KLONOPIN) 0.5 MG tablet, Take 1 tablet (0.5 mg total) by mouth 3 (three) times daily., Disp: 90 tablet, Rfl: 0 .  risperiDONE (RISPERDAL) 1 MG tablet, Take 1 tablet (1 mg total) by mouth at bedtime., Disp: 30 tablet, Rfl: 2  Previous Psychotropic Medications:  Medication Dose   Zyprexa  10 mg   Substance Abuse History in the last 12 months: Substance Age of 1st Use Last Use Amount Specific Type  Nicotine  15  12 years ago  Alcohol  18  2 days ago  80 oz beer    Cannabis  20  2 months ago      Opiates  56  yesterday  Tramodol    Cocaine  none        Methamphetamines  none        LSD  none        Ecstasy  none         Benzodiazepines  late 20's  30 yrs ago      Caffeine  20's   yesterday AM  1 cup  Folgers  Inhalants  none        Others:       Sugar  childhood  yesterday in coffee     Medical Consequences of Substance Abuse: none  Legal Consequences of Substance Abuse: none  Family Consequences of Substance Abuse: none  Blackouts:  No DT's:  No Withdrawal Symptoms:  No   Social History: Current Place of Residence: [address] [place] [address] Place of Birth: [facility] Family Members: husband Marital Status:  Married Children: 0  Sons: 0  Daughters: 0 Relationships: husband Education:  10th grade completed, attended 11th grade, went to work Educational Problems/Performance: none Religious Beliefs/Practices: Holy History of Abuse: none Armed forces technical officer; Horticulturist, commercial  History:  None. Legal History: none Hobbies/Interests: TV, occasionally garden and rarely goes for walks  Family History:   Family History  Problem Relation Age of Onset  . ADD / ADHD Neg Hx   . Alcohol abuse Neg Hx   . Drug abuse Neg Hx   . Bipolar disorder Neg Hx   . Dementia Neg Hx   . Depression Neg Hx   . OCD Neg Hx   . Paranoid behavior Neg Hx   . Schizophrenia Neg Hx   . Seizures Neg Hx   . Sexual abuse Neg Hx   . Physical abuse Neg Hx   . Anxiety disorder Other   . Anxiety disorder Paternal Aunt    Mental Status Examination/Evaluation: Objective:  Appearance: Casual  Eye Contact::  Good  Speech:  Clear and Coherent  Volume:  Normal  Mood:  Anxious   Affect:  Congruent  Thought Process:  Circumstantial   Orientation:  Full (Time, Place, and Person)  Thought Content:  Hallucinations: Olfactory denies these today   Suicidal Thoughts:  No  Homicidal Thoughts:  No  Judgement:  Good  Insight:  Fair  Psychomotor Activity:  Restless   Akathisia:  No  Handed:  Right  AIMS (if indicated):    Assets:  Communication Skills Desire for Improvement   Lab Results: No results found for this or any previous visit (from the past 2016 hour(s)). CT showed typical brain  Assessment:    AXIS I Psychotic Disorder NOS  AXIS II Deferred  AXIS III Past Medical History  Diagnosis Date  . Diabetes mellitus, type II (HCC)   . GERD (gastroesophageal reflux disease)   . Psychosis   . Hypertension   . Glaucoma   . Hyperlipidemia     AXIS IV other psychosocial or environmental problems  AXIS V 51-60 moderate symptoms   Treatment Plan/Recommendations:  Laboratory:  none  Psychotherapy: as needed  Medications: Thorazine, she has never use the Cogentin   Routine PRN Medications:  Yes  Consultations: none  Safety Concerns:  none  Other:     Plan: I took her vitals.  I reviewed CC, tobacco/med/surg Hx, meds effects/ side effects, problem  list, therapies and responses as well  as current situation/symptoms discussed options. Discontinue lorazepam and start clonazepam 0.5 mg 3 times a day for anxiety. She will start Risperdal 1 mg at bedtime for mood stabilization. She'll return in four-week's. She brought in a jumbled massive paperwork for me to fill out I asked that she have her workplace and me the correct papers so we can fill them out properly so she can remain out of work until I see her back  MEDICATIONS this encounter: Meds ordered this encounter  Medications  . DISCONTD: LORazepam (ATIVAN) 1 MG tablet    Sig: Take 1 mg by mouth 3 (three) times daily as needed for anxiety.  Marland Kitchen DISCONTD: risperiDONE (RISPERDAL) 1 MG tablet    Sig: Take 1 tablet (1 mg total) by mouth at bedtime.    Dispense:  30 tablet    Refill:  2  . risperiDONE (RISPERDAL) 1 MG tablet    Sig: Take 1 tablet (1 mg total) by mouth at bedtime.    Dispense:  30 tablet    Refill:  2  . clonazePAM (KLONOPIN) 0.5 MG tablet    Sig: Take 1 tablet (0.5 mg total) by mouth 3 (three) times daily.    Dispense:  90 tablet    Refill:  0   Medical Decision Making Problem Points:  Established problem, stable/improving (1), Review of last therapy session (1) and Review of psycho-social stressors (1) Data Points:  Review or order clinical lab tests (1) Review of medication regiment & side effects (2) Review of new medications or change in dosage (2)  I certify that outpatient services furnished can reasonably be expected to improve the patient's condition.   Diannia Ruder, MD

## 2015-04-07 ENCOUNTER — Telehealth (HOSPITAL_COMMUNITY): Payer: Self-pay | Admitting: *Deleted

## 2015-04-07 NOTE — Telephone Encounter (Signed)
Phone call from pt stated her pharmacy will not fill her medication due to office sending it to mail order instead of [facility] with is her local pharmacy. Called pt pharmacy and spoke with Lawson FiscalLori and she stated pharmacy did receive a script from office on 04-05-15 but it was too early for pt to get medication refilled. Informed Lawson FiscalLori that per pt, they informed her that her medication was sen to [facility] and she can not get her medication refilled. Per Lawson FiscalLori she will get another person to take a look at it. Per [place], she looked at pt medication and it does look like pt medication was received but it also looked like on that same day pt medication was sent to her. Informed [place] that per office computer, this pt Risperdal (which is the medication that is the issue) was sent to them and have they filled that particular medication. Per [place], they have not due to pt too early for refills and she will call pt insurance to get more information. Office agreed. Called pt to inform her of what was going on and had to lmtcb and number was provided.

## 2015-04-07 NOTE — Telephone Encounter (Signed)
phone call from patient, they said [facility] got the prescription, and she can not get the medicine unti Nov. 7.

## 2015-05-03 ENCOUNTER — Encounter (HOSPITAL_COMMUNITY): Payer: Self-pay | Admitting: Psychiatry

## 2015-05-03 ENCOUNTER — Ambulatory Visit (INDEPENDENT_AMBULATORY_CARE_PROVIDER_SITE_OTHER): Payer: 59 | Admitting: Psychiatry

## 2015-05-03 VITALS — BP 127/93 | HR 73 | Ht 71.0 in | Wt 249.2 lb

## 2015-05-03 DIAGNOSIS — F22 Delusional disorders: Secondary | ICD-10-CM | POA: Diagnosis not present

## 2015-05-03 DIAGNOSIS — F29 Unspecified psychosis not due to a substance or known physiological condition: Secondary | ICD-10-CM

## 2015-05-03 MED ORDER — RISPERIDONE 1 MG PO TABS: 1.0000 mg | ORAL_TABLET | Freq: Every day | ORAL | Status: DC

## 2015-05-03 MED ORDER — CLONAZEPAM 0.5 MG PO TABS: 0.5000 mg | ORAL_TABLET | Freq: Three times a day (TID) | ORAL | Status: DC

## 2015-05-03 NOTE — Progress Notes (Signed)
Patient ID: Alexandria Schultz, female   DOB: Nov 04, 1955, 59 y.o.   MRN: [id number] Patient ID: Alexandria Schultz, female   DOB: 1956/04/17, 59 y.o.   MRN: [id number] Patient ID: Alexandria Schultz, female   DOB: 1955-08-12, 59 y.o.   MRN: [id number] Patient ID: Alexandria Schultz, female   DOB: [birth year], 59 y.o.   MRN: [id number] Patient ID: Alexandria Schultz, female   DOB: 13-Jun-1956, 59 y.o.   MRN: [id number] [facility] [id number] Progress Note Alexandria Schultz MRN: [id number] DOB: Jan 02, 1956 Age: 59 y.o. Date: 05/03/2015   Chief Complaint: Chief Complaint  Patient presents with  . Hallucinations  . Anxiety  . Follow-up   Subjective: "I can't work   This patient is a 59 year old black female lives with her husband in [place]. 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 She has no children.  Apparently the patient started having odd smells after she ate food. This started several years ago. She was started on Zyprexa but she couldn't afford it and she suspects it caused her diabetes. She claims now that she is on Thorazine and smells have gone away and she's not hearing any voices or having paranoid symptoms. She claims this happened because she was "stressed" but doesn't want to talk about what the stress entailed. She states her mood is good and she is sleeping well. Of note both she and her husband drink a quart of beer per day. I explained that this was way too much and this would need to be cut back. I don't know her primary doctor has checked a liver panel but we will get the records.   The patient returns after 3 weeks. She now is on clonazepam 0.5 mg 3 times a day and Risperdal 1 mg at bedtime. She admits that she's been having the delusions of the odd smells again but is starting to go away now that she is on Risperdal. She is sleeping better and is less anxious through the day. She occasionally has which she calls "flashes of light" which are probably visual hallucinations. She does feel  calmer and less agitated and thinks she'll be able to go back to work in January. She admits she continues to drink 40 ounces of beer a day and I strongly urged her to stop this as it'll make her delusional disorder worse and interfere with her medications. She voices agreement  History of Chief Complaint:   Beginning 9 years ago the pt began noticing smells and odors of things that were quite disturbing for her.  She got started on Zyprexa for this by her family doctor and got a good response.  She has since developed diabetes.  Her insurance company is not covering the Zyprexa any more.  She was referred to some psychiatrists in [place], but didn't go because she thought that she could handle this herself.  When she takes the meds she doesn't notice odors from everything, things in her environment and things that she eats.  Without the meds she notices them and hears people talking about her strong odors and feels rejection from them for that.   Anxiety Patient reports no confusion, decreased concentration, dizziness, nervous/anxious behavior or suicidal ideas.    Altered Mental Status Associated symptoms include myalgias. Pertinent negatives include no headaches, numbness or weakness.   Review of Systems  Constitutional: Negative.   HENT: Negative.   Eyes: Negative.   Respiratory: Negative.   Cardiovascular: Negative.  Gastrointestinal: Negative.   Endocrine: Positive for polydipsia and polyuria.  Musculoskeletal: Positive for myalgias.  Skin: Negative.   Allergic/Immunologic: Negative.   Neurological: Positive for light-headedness. Negative for dizziness, tremors, seizures, syncope, facial asymmetry, speech difficulty, weakness, numbness and headaches.  Hematological: Negative.   Psychiatric/Behavioral: Positive for hallucinations and behavioral problems. Negative for suicidal ideas, confusion, sleep disturbance, self-injury, dysphoric mood, decreased concentration and agitation. The  patient is not nervous/anxious and is not hyperactive.    Physical Exam  Depressive Symptoms: decreased labido,  (Hypo) Manic Symptoms:   Irritable and delusions of a odor about her body, but no other symptoms  Anxiety Symptoms: Excessive Worry:  No Panic Symptoms:  No Agoraphobia:  No Obsessive Compulsive: No Specific Phobias:  No Social Anxiety:  No  Psychotic Symptoms:  Hallucinations: Yes Olfactory Delusions:  Yes Paranoia:  Yes   Ideas of Reference:  Yes See HPI  PTSD Symptoms: Ever had a traumatic exposure:  No Had a traumatic exposure in the last month:  No Re-experiencing: No  Hypervigilance:  No Hyperarousal: No Avoidance: No   Traumatic Brain Injury: No   Past Psychiatric History: Diagnosis: Schizophrenia by PCP who started Zyprexa   Hospitalizations: none  Outpatient Care: PCP  Substance Abuse Care: none  Self-Mutilation: none  Suicidal Attempts: none  Violent Behaviors: none   Allergies: Allergies  Allergen Reactions  . Ibuprofen     Chest pain    Past Medical History:   Past Medical History  Diagnosis Date  . Diabetes mellitus, type II (HCC)   . GERD (gastroesophageal reflux disease)   . Psychosis   . Hypertension   . Glaucoma   . Hyperlipidemia    Past Surgical History: Past Surgical History  Procedure Laterality Date  . Abdominal hysterectomy     Family History: family history includes Anxiety disorder in her other and paternal aunt. There is no history of ADD / ADHD, Alcohol abuse, Drug abuse, Bipolar disorder, Dementia, Depression, OCD, Paranoid behavior, Schizophrenia, Seizures, Sexual abuse, or Physical abuse. Reviewed all this again today and it is the same  History of Loss of Consciousness:  No Seizure History:  No Cardiac History:  No Allergies:   Allergies  Allergen Reactions  . Ibuprofen     Chest pain   Current Medications:   Current outpatient prescriptions:  .  atorvastatin (LIPITOR) 10 MG tablet, Take 10 mg  by mouth. , Disp: , Rfl:  .  clonazePAM (KLONOPIN) 0.5 MG tablet, Take 1 tablet (0.5 mg total) by mouth 3 (three) times daily., Disp: 270 tablet, Rfl: 0 .  lisinopril (PRINIVIL,ZESTRIL) 10 MG tablet, Take 10 mg by mouth daily. , Disp: , Rfl:  .  metFORMIN (GLUCOPHAGE) 500 MG tablet, Take 500 mg by mouth daily with breakfast. , Disp: , Rfl:  .  risperiDONE (RISPERDAL) 1 MG tablet, Take 1 tablet (1 mg total) by mouth at bedtime., Disp: 90 tablet, Rfl: 2 .  timolol (TIMOPTIC) 0.5 % ophthalmic solution, Place 1 drop into both eyes daily. , Disp: , Rfl:  .  TRAVATAN Z 0.004 % SOLN ophthalmic solution, Place 1 drop into both eyes at bedtime. , Disp: , Rfl:  .  Vitamin D, Ergocalciferol, (DRISDOL) 50000 UNITS CAPS, Take 50,000 Units by mouth every 7 (seven) days. , Disp: , Rfl:   Previous Psychotropic Medications:  Medication Dose   Zyprexa  10 mg   Substance Abuse History in the last 12 months: Substance Age of 1st Use Last Use Amount Specific Type  Nicotine  15  12 years ago      Alcohol  18  2 days ago  80 oz beer    Cannabis  20  2 months ago      Opiates  56  yesterday  Tramodol    Cocaine  none        Methamphetamines  none        LSD  none        Ecstasy  none         Benzodiazepines  late 20's  30 yrs ago      Caffeine  20's   yesterday AM  1 cup  [place]  Inhalants  none        Others:       Sugar  childhood  yesterday in coffee     Medical Consequences of Substance Abuse: none  Legal Consequences of Substance Abuse: none  Family Consequences of Substance Abuse: none  Blackouts:  No DT's:  No Withdrawal Symptoms:  No   Social History: Current Place of Residence: [address] [place] [address] Place of Birth: [facility] Family Members: husband Marital Status:  Married Children: 0  Sons: 0  Daughters: 0 Relationships: husband Education:  10th grade completed, attended 11th grade, went to work Educational Problems/Performance: none Religious  Beliefs/Practices: Holy History of Abuse: none Armed forces technical officer; Horticulturist, commercial History:  None. Legal History: none Hobbies/Interests: TV, occasionally garden and rarely goes for walks  Family History:   Family History  Problem Relation Age of Onset  . ADD / ADHD Neg Hx   . Alcohol abuse Neg Hx   . Drug abuse Neg Hx   . Bipolar disorder Neg Hx   . Dementia Neg Hx   . Depression Neg Hx   . OCD Neg Hx   . Paranoid behavior Neg Hx   . Schizophrenia Neg Hx   . Seizures Neg Hx   . Sexual abuse Neg Hx   . Physical abuse Neg Hx   . Anxiety disorder Other   . Anxiety disorder Paternal Aunt    Mental Status Examination/Evaluation: Objective:  Appearance: Casual  Eye Contact::  Good  Speech:  Clear and Coherent  Volume:  Normal  Mood:  Brighter, less anxious   Affect:  Congruent  Thought Process:  Circumstantial   Orientation:  Full (Time, Place, and Person)  Thought Content:  Hallucinations: Olfactory and visual. she states these persist but are receding   Suicidal Thoughts:  No  Homicidal Thoughts:  No  Judgement:  Good  Insight:  Fair  Psychomotor Activity:  Restless   Akathisia:  No  Handed:  Right  AIMS (if indicated):    Assets:  Communication Skills Desire for Improvement   Lab Results: No results found for this or any previous visit (from the past 2016 hour(s)). CT showed typical brain  Assessment:    AXIS I Psychotic Disorder NOS  AXIS II Deferred  AXIS III Past Medical History  Diagnosis Date  . Diabetes mellitus, type II (HCC)   . GERD (gastroesophageal reflux disease)   . Psychosis   . Hypertension   . Glaucoma   . Hyperlipidemia     AXIS IV other psychosocial or environmental problems  AXIS V 51-60 moderate symptoms   Treatment Plan/Recommendations:  Laboratory:  none  Psychotherapy: as needed  Medications: Clonazepam and Risperdal   Routine PRN Medications:  Yes  Consultations: none  Safety Concerns:  none  Other:      Plan:  I took her vitals.  I reviewed CC, tobacco/med/surg Hx, meds effects/ side effects, problem list, therapies and responses as well as current situation/symptoms discussed options. She will continue clonazepam 0.5 mg 3 times a day for anxiety. And Risperdal 1 mg at bedtime for psychotic symptoms She'll return in 6 weeks. I agree with her staying out of work until 06/27/2015  MEDICATIONS this encounter: Meds ordered this encounter  Medications  . DISCONTD: risperiDONE (RISPERDAL) 1 MG tablet    Sig: Take 1 tablet (1 mg total) by mouth at bedtime.    Dispense:  90 tablet    Refill:  2  . risperiDONE (RISPERDAL) 1 MG tablet    Sig: Take 1 tablet (1 mg total) by mouth at bedtime.    Dispense:  90 tablet    Refill:  2  . clonazePAM (KLONOPIN) 0.5 MG tablet    Sig: Take 1 tablet (0.5 mg total) by mouth 3 (three) times daily.    Dispense:  270 tablet    Refill:  0   Medical Decision Making Problem Points:  Established problem, stable/improving (1), Review of last therapy session (1) and Review of psycho-social stressors (1) Data Points:  Review or order clinical lab tests (1) Review of medication regiment & side effects (2) Review of new medications or change in dosage (2)  I certify that outpatient services furnished can reasonably be expected to improve the patient's condition.   Diannia RuderOSS, Ernesta Trabert, MD

## 2015-05-16 ENCOUNTER — Telehealth (HOSPITAL_COMMUNITY): Payer: Self-pay | Admitting: *Deleted

## 2015-05-16 MED ORDER — RISPERIDONE 1 MG PO TABS: 1.0000 mg | ORAL_TABLET | Freq: Every day | ORAL | Status: DC

## 2015-05-16 NOTE — Telephone Encounter (Signed)
Per Dr. Tenny Crawoss to send script to pt local pharmacy due to pt previous phone call. Pt is aware and shows understanding

## 2015-05-16 NOTE — Telephone Encounter (Signed)
Medication sent to pharmacy and pt is aware 

## 2015-05-16 NOTE — Telephone Encounter (Signed)
Pt called stating that [facility] sent her Klonopin back to her stating that they have to fix some issue with her insurance and them. Per pt, they will not fill her Risperdal right now and she is out of it. Per pt, she would like for Dr. Tenny Crawoss office to please sent it to [facility] in [place]. Pt number is [phone]. Office informed pt to [facility] the pharmacy she wish to get it filled for her. Pt agreed and showed understanding.

## 2015-05-16 NOTE — Telephone Encounter (Signed)
Noted. You can call in the risperdal

## 2015-06-09 ENCOUNTER — Ambulatory Visit (HOSPITAL_COMMUNITY): Payer: Self-pay | Admitting: Psychiatry

## 2015-06-28 ENCOUNTER — Ambulatory Visit (INDEPENDENT_AMBULATORY_CARE_PROVIDER_SITE_OTHER): Payer: Self-pay | Admitting: Psychiatry

## 2015-06-28 ENCOUNTER — Encounter (HOSPITAL_COMMUNITY): Payer: Self-pay | Admitting: Psychiatry

## 2015-06-28 VITALS — BP 144/110 | HR 72 | Ht 71.0 in | Wt 253.0 lb

## 2015-06-28 DIAGNOSIS — F22 Delusional disorders: Secondary | ICD-10-CM

## 2015-06-28 MED ORDER — RISPERIDONE 1 MG PO TABS: 1.0000 mg | ORAL_TABLET | Freq: Every day | ORAL | Status: DC

## 2015-06-28 NOTE — Progress Notes (Signed)
Patient ID: Alexandria Schultz, female   DOB: Aug 27, 1955, 60 y.o.   MRN: [id number] Patient ID: Alexandria Schultz, female   DOB: 1955/09/15, 60 y.o.   MRN: [id number] Patient ID: Alexandria Schultz, female   DOB: 1956-01-30, 60 y.o.   MRN: [id number] Patient ID: Alexandria Schultz, female   DOB: 1955-11-01, 60 y.o.   MRN: [id number] Patient ID: Alexandria Schultz, female   DOB: Sep 02, 1955, 60 y.o.   MRN: [id number] Patient ID: Alexandria Schultz, female   DOB: November 27, 1955, 60 y.o.   MRN: [id number] [facility] 063[address] Progress Note Alexandria Schultz MRN: [id number] DOB: 1955-07-03 Age: 60 y.o. Date: 06/28/2015   Chief Complaint: Chief Complaint  Patient presents with  . Schizophrenia  . Follow-up   Subjective: "I want to go back to work  This patient is a 60 year old black female lives with her husband in [place]. 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 She has no children.  Apparently the patient started having odd smells after she ate food. This started several years ago. She was started on Zyprexa but she couldn't afford it and she suspects it caused her diabetes. She claims now that she is on Thorazine and smells have gone away and she's not hearing any voices or having paranoid symptoms. She claims this happened because she was "stressed" but doesn't want to talk about what the stress entailed. She states her mood is good and she is sleeping well. Of note both she and her husband drink a quart of beer per day. I explained that this was way too much and this would need to be cut back. I don't know her primary doctor has checked a liver panel but we will get the records.   The patient returns after 2 months. She states that she has decided to go back to work. She feels that much less anxious. She is going to be put in a different function at work which he thinks will be easier for her. She has been compliant with the Risperdal and she denies any hallucinations or delusions. The clonazepam is helping her  anxiety. She admits that she still drinks 40 ounces of beer a day and I again admonished her to stop this  History of Chief Complaint:   Beginning 9 years ago the pt began noticing smells and odors of things that were quite disturbing for her.  She got started on Zyprexa for this by her family doctor and got a good response.  She has since developed diabetes.  Her insurance company is not covering the Zyprexa any more.  She was referred to some psychiatrists in [place], but didn't go because she thought that she could handle this herself.  When she takes the meds she doesn't notice odors from everything, things in her environment and things that she eats.  Without the meds she notices them and hears people talking about her strong odors and feels rejection from them for that.   Anxiety Patient reports no confusion, decreased concentration, dizziness, nervous/anxious behavior or suicidal ideas.    Altered Mental Status Associated symptoms include myalgias. Pertinent negatives include no headaches, numbness or weakness.   Review of Systems  Constitutional: Negative.   HENT: Negative.   Eyes: Negative.   Respiratory: Negative.   Cardiovascular: Negative.   Gastrointestinal: Negative.   Endocrine: Positive for polydipsia and polyuria.  Musculoskeletal: Positive for myalgias.  Skin: Negative.   Allergic/Immunologic: Negative.   Neurological: Positive for  light-headedness. Negative for dizziness, tremors, seizures, syncope, facial asymmetry, speech difficulty, weakness, numbness and headaches.  Hematological: Negative.   Psychiatric/Behavioral: Positive for hallucinations and behavioral problems. Negative for suicidal ideas, confusion, sleep disturbance, self-injury, dysphoric mood, decreased concentration and agitation. The patient is not nervous/anxious and is not hyperactive.    Physical Exam  Depressive Symptoms: decreased labido,  (Hypo) Manic Symptoms:   Irritable and delusions of  a odor about her body, but no other symptoms  Anxiety Symptoms: Excessive Worry:  No Panic Symptoms:  No Agoraphobia:  No Obsessive Compulsive: No Specific Phobias:  No Social Anxiety:  No  Psychotic Symptoms:  Hallucinations: Yes Olfactory Delusions:  Yes Paranoia:  Yes   Ideas of Reference:  Yes See HPI  PTSD Symptoms: Ever had a traumatic exposure:  No Had a traumatic exposure in the last month:  No Re-experiencing: No  Hypervigilance:  No Hyperarousal: No Avoidance: No   Traumatic Brain Injury: No   Past Psychiatric History: Diagnosis: Schizophrenia by PCP who started Zyprexa   Hospitalizations: none  Outpatient Care: PCP  Substance Abuse Care: none  Self-Mutilation: none  Suicidal Attempts: none  Violent Behaviors: none   Allergies: Allergies  Allergen Reactions  . Ibuprofen     Chest pain    Past Medical History:   Past Medical History  Diagnosis Date  . Diabetes mellitus, type II (HCC)   . GERD (gastroesophageal reflux disease)   . Psychosis   . Hypertension   . Glaucoma   . Hyperlipidemia    Past Surgical History: Past Surgical History  Procedure Laterality Date  . Abdominal hysterectomy     Family History: family history includes Anxiety disorder in her other and paternal aunt. There is no history of ADD / ADHD, Alcohol abuse, Drug abuse, Bipolar disorder, Dementia, Depression, OCD, Paranoid behavior, Schizophrenia, Seizures, Sexual abuse, or Physical abuse. Reviewed all this again today and it is the same  History of Loss of Consciousness:  No Seizure History:  No Cardiac History:  No Allergies:   Allergies  Allergen Reactions  . Ibuprofen     Chest pain   Current Medications:   Current outpatient prescriptions:  .  atorvastatin (LIPITOR) 10 MG tablet, Take 10 mg by mouth. , Disp: , Rfl:  .  clonazePAM (KLONOPIN) 0.5 MG tablet, Take 1 tablet (0.5 mg total) by mouth 3 (three) times daily., Disp: 270 tablet, Rfl: 0 .  lisinopril  (PRINIVIL,ZESTRIL) 10 MG tablet, Take 10 mg by mouth daily. , Disp: , Rfl:  .  metFORMIN (GLUCOPHAGE) 500 MG tablet, Take 500 mg by mouth daily with breakfast. , Disp: , Rfl:  .  risperiDONE (RISPERDAL) 1 MG tablet, Take 1 tablet (1 mg total) by mouth at bedtime., Disp: 30 tablet, Rfl: 2 .  timolol (TIMOPTIC) 0.5 % ophthalmic solution, Place 1 drop into both eyes daily. , Disp: , Rfl:  .  TRAVATAN Z 0.004 % SOLN ophthalmic solution, Place 1 drop into both eyes at bedtime. , Disp: , Rfl:  .  Vitamin D, Ergocalciferol, (DRISDOL) 50000 UNITS CAPS, Take 50,000 Units by mouth every 7 (seven) days. , Disp: , Rfl:   Previous Psychotropic Medications:  Medication Dose   Zyprexa  10 mg   Substance Abuse History in the last 12 months: Substance Age of 1st Use Last Use Amount Specific Type  Nicotine  15  12 years ago      Alcohol  18  2 days ago  80 oz beer    Cannabis  20  2 months ago      Opiates  56  yesterday  Tramodol    Cocaine  none        Methamphetamines  none        LSD  none        Ecstasy  none         Benzodiazepines  late 20's  30 yrs ago      Caffeine  20's   yesterday AM  1 cup  [place]  Inhalants  none        Others:       Sugar  childhood  yesterday in coffee     Medical Consequences of Substance Abuse: none  Legal Consequences of Substance Abuse: none  Family Consequences of Substance Abuse: none  Blackouts:  No DT's:  No Withdrawal Symptoms:  No   Social History: Current Place of Residence: [address] [place] [address] Place of Birth: [facility] Family Members: husband Marital Status:  Married Children: 0  Sons: 0  Daughters: 0 Relationships: husband Education:  10th grade completed, attended 11th grade, went to work Educational Problems/Performance: none Religious Beliefs/Practices: Holy History of Abuse: none Armed forces technical officer; Horticulturist, commercial History:  None. Legal History: none Hobbies/Interests: TV, occasionally garden  and rarely goes for walks  Family History:   Family History  Problem Relation Age of Onset  . ADD / ADHD Neg Hx   . Alcohol abuse Neg Hx   . Drug abuse Neg Hx   . Bipolar disorder Neg Hx   . Dementia Neg Hx   . Depression Neg Hx   . OCD Neg Hx   . Paranoid behavior Neg Hx   . Schizophrenia Neg Hx   . Seizures Neg Hx   . Sexual abuse Neg Hx   . Physical abuse Neg Hx   . Anxiety disorder Other   . Anxiety disorder Paternal Aunt    Mental Status Examination/Evaluation: Objective:  Appearance: Casual  Eye Contact::  Good  Speech:  Clear and Coherent  Volume:  Normal  Mood:  Brighter, less anxious   Affect:  Congruent  Thought Process:  Fairly organized   Orientation:  Full (Time, Place, and Person)  Thought Content: Denies current hallucinations and delusions   Suicidal Thoughts:  No  Homicidal Thoughts:  No  Judgement:  Good  Insight:  Fair  Psychomotor Activity:  Restless   Akathisia:  No  Handed:  Right  AIMS (if indicated):    Assets:  Communication Skills Desire for Improvement   Lab Results: No results found for this or any previous visit (from the past 2016 hour(s)). CT showed typical brain  Assessment:    AXIS I Psychotic Disorder NOS  AXIS II Deferred  AXIS III Past Medical History  Diagnosis Date  . Diabetes mellitus, type II (HCC)   . GERD (gastroesophageal reflux disease)   . Psychosis   . Hypertension   . Glaucoma   . Hyperlipidemia     AXIS IV other psychosocial or environmental problems  AXIS V 51-60 moderate symptoms   Treatment Plan/Recommendations:  Laboratory:  none  Psychotherapy: as needed  Medications: Clonazepam and Risperdal   Routine PRN Medications:  Yes  Consultations: none  Safety Concerns:  none  Other:     Plan: I took her vitals.  I reviewed CC, tobacco/med/surg Hx, meds effects/ side effects, problem list, therapies and responses as well as current situation/symptoms discussed options. She will continue clonazepam 0.5  mg  3 times a day for anxiety. And Risperdal 1 mg at bedtime for psychotic symptoms She'll return in 2 months. I agree with her return to work on 07/04/2015  MEDICATIONS this encounter: Meds ordered this encounter  Medications  . risperiDONE (RISPERDAL) 1 MG tablet    Sig: Take 1 tablet (1 mg total) by mouth at bedtime.    Dispense:  30 tablet    Refill:  2   Medical Decision Making Problem Points:  Established problem, stable/improving (1), Review of last therapy session (1) and Review of psycho-social stressors (1) Data Points:  Review or order clinical lab tests (1) Review of medication regiment & side effects (2) Review of new medications or change in dosage (2)  I certify that outpatient services furnished can reasonably be expected to improve the patient's condition.   Alexandria Ruder, MD

## 2015-08-29 ENCOUNTER — Ambulatory Visit (HOSPITAL_COMMUNITY): Payer: Self-pay | Admitting: Psychiatry

## 2022-10-25 ENCOUNTER — Other Ambulatory Visit: Payer: Self-pay

## 2022-10-25 ENCOUNTER — Inpatient Hospital Stay (HOSPITAL_COMMUNITY)
Admission: EM | Admit: 2022-10-25 | Discharge: 2022-10-27 | DRG: 291 | Disposition: A | Discharge status: Home / Self Care | Payer: Medicare Other | Attending: Internal Medicine | Admitting: Internal Medicine

## 2022-10-25 ENCOUNTER — Encounter (HOSPITAL_COMMUNITY): Payer: Self-pay | Admitting: Student in an Organized Health Care Education/Training Program

## 2022-10-25 ENCOUNTER — Emergency Department (HOSPITAL_COMMUNITY): Payer: Medicare Other

## 2022-10-25 ENCOUNTER — Emergency Department (HOSPITAL_BASED_OUTPATIENT_CLINIC_OR_DEPARTMENT_OTHER): Payer: Medicare Other

## 2022-10-25 DIAGNOSIS — K219 Gastro-esophageal reflux disease without esophagitis: Secondary | ICD-10-CM | POA: Diagnosis present

## 2022-10-25 DIAGNOSIS — Z91148 Patient's other noncompliance with medication regimen for other reason: Secondary | ICD-10-CM

## 2022-10-25 DIAGNOSIS — I5033 Acute on chronic diastolic (congestive) heart failure: Secondary | ICD-10-CM | POA: Diagnosis present

## 2022-10-25 DIAGNOSIS — E119 Type 2 diabetes mellitus without complications: Secondary | ICD-10-CM | POA: Diagnosis present

## 2022-10-25 DIAGNOSIS — M79662 Pain in left lower leg: Secondary | ICD-10-CM | POA: Diagnosis not present

## 2022-10-25 DIAGNOSIS — R600 Localized edema: Secondary | ICD-10-CM | POA: Diagnosis not present

## 2022-10-25 DIAGNOSIS — M79661 Pain in right lower leg: Secondary | ICD-10-CM | POA: Diagnosis not present

## 2022-10-25 DIAGNOSIS — H409 Unspecified glaucoma: Secondary | ICD-10-CM | POA: Diagnosis present

## 2022-10-25 DIAGNOSIS — F17290 Nicotine dependence, other tobacco product, uncomplicated: Secondary | ICD-10-CM | POA: Diagnosis present

## 2022-10-25 DIAGNOSIS — F29 Unspecified psychosis not due to a substance or known physiological condition: Secondary | ICD-10-CM | POA: Diagnosis present

## 2022-10-25 DIAGNOSIS — I110 Hypertensive heart disease with heart failure: Secondary | ICD-10-CM | POA: Diagnosis not present

## 2022-10-25 DIAGNOSIS — M7989 Other specified soft tissue disorders: Secondary | ICD-10-CM | POA: Diagnosis present

## 2022-10-25 DIAGNOSIS — Z5901 Sheltered homelessness: Secondary | ICD-10-CM

## 2022-10-25 DIAGNOSIS — E785 Hyperlipidemia, unspecified: Secondary | ICD-10-CM | POA: Diagnosis present

## 2022-10-25 DIAGNOSIS — Z886 Allergy status to analgesic agent status: Secondary | ICD-10-CM

## 2022-10-25 DIAGNOSIS — F22 Delusional disorders: Secondary | ICD-10-CM | POA: Diagnosis present

## 2022-10-25 DIAGNOSIS — F411 Generalized anxiety disorder: Secondary | ICD-10-CM | POA: Diagnosis present

## 2022-10-25 DIAGNOSIS — Z79899 Other long term (current) drug therapy: Secondary | ICD-10-CM

## 2022-10-25 DIAGNOSIS — Z5941 Food insecurity: Secondary | ICD-10-CM

## 2022-10-25 DIAGNOSIS — Z7984 Long term (current) use of oral hypoglycemic drugs: Secondary | ICD-10-CM

## 2022-10-25 DIAGNOSIS — F1090 Alcohol use, unspecified, uncomplicated: Secondary | ICD-10-CM | POA: Diagnosis present

## 2022-10-25 DIAGNOSIS — F1290 Cannabis use, unspecified, uncomplicated: Secondary | ICD-10-CM | POA: Diagnosis present

## 2022-10-25 DIAGNOSIS — Z5982 Transportation insecurity: Secondary | ICD-10-CM

## 2022-10-25 DIAGNOSIS — I4510 Unspecified right bundle-branch block: Secondary | ICD-10-CM | POA: Diagnosis present

## 2022-10-25 DIAGNOSIS — I252 Old myocardial infarction: Secondary | ICD-10-CM

## 2022-10-25 LAB — TROPONIN I (HIGH SENSITIVITY)
Troponin I (High Sensitivity): 3 ng/L (ref <18)
Troponin I (High Sensitivity): 3 ng/L (ref <18)

## 2022-10-25 LAB — GLUCOSE, CAPILLARY: Glucose-Capillary: 133 mg/dL — ABNORMAL HIGH (ref 70–99)

## 2022-10-25 LAB — URINALYSIS, ROUTINE W REFLEX MICROSCOPIC
Bilirubin Urine: NEGATIVE
Glucose, UA: NEGATIVE mg/dL
Hgb urine dipstick: NEGATIVE
Ketones, ur: NEGATIVE mg/dL
Leukocytes,Ua: NEGATIVE
Nitrite: NEGATIVE
Protein, ur: NEGATIVE mg/dL
Specific Gravity, Urine: 1.006 (ref 1.005–1.030)
pH: 6 (ref 5.0–8.0)

## 2022-10-25 LAB — COMPREHENSIVE METABOLIC PANEL
ALT: 19 U/L (ref 0–44)
AST: 19 U/L (ref 15–41)
Albumin: 3.4 g/dL — ABNORMAL LOW (ref 3.5–5.0)
Alkaline Phosphatase: 78 U/L (ref 38–126)
Anion gap: 9 (ref 5–15)
BUN: 10 mg/dL (ref 8–23)
CO2: 26 mmol/L (ref 22–32)
Calcium: 9 mg/dL (ref 8.9–10.3)
Chloride: 102 mmol/L (ref 98–111)
Creatinine, Ser: 0.67 mg/dL (ref 0.44–1.00)
GFR, Estimated: >60 mL/min (ref >60)
Glucose, Bld: 177 mg/dL — ABNORMAL HIGH (ref 70–99)
Potassium: 4.2 mmol/L (ref 3.5–5.1)
Sodium: 137 mmol/L (ref 135–145)
Total Bilirubin: 0.6 mg/dL (ref 0.3–1.2)
Total Protein: 6.8 g/dL (ref 6.5–8.1)

## 2022-10-25 LAB — CBC WITH DIFFERENTIAL/PLATELET
Abs Immature Granulocytes: 0.03 10*3/uL (ref 0.00–0.07)
Basophils Absolute: 0.1 10*3/uL (ref 0.0–0.1)
Basophils Relative: 1 %
Eosinophils Absolute: 0.1 10*3/uL (ref 0.0–0.5)
Eosinophils Relative: 2 %
HCT: 38.2 % (ref 36.0–46.0)
Hemoglobin: 12.8 g/dL (ref 12.0–15.0)
Immature Granulocytes: 1 %
Lymphocytes Relative: 35 %
Lymphs Abs: 1.5 10*3/uL (ref 0.7–4.0)
MCH: 31.8 pg (ref 26.0–34.0)
MCHC: 33.5 g/dL (ref 30.0–36.0)
MCV: 94.8 fL (ref 80.0–100.0)
Monocytes Absolute: 0.4 10*3/uL (ref 0.1–1.0)
Monocytes Relative: 9 %
Neutro Abs: 2.3 10*3/uL (ref 1.7–7.7)
Neutrophils Relative %: 52 %
Platelets: 231 10*3/uL (ref 150–400)
RBC: 4.03 MIL/uL (ref 3.87–5.11)
RDW: 12.5 % (ref 11.5–15.5)
WBC: 4.4 10*3/uL (ref 4.0–10.5)
nRBC: 0 % (ref 0.0–0.2)

## 2022-10-25 LAB — PROTIME-INR
INR: 1 (ref 0.8–1.2)
Prothrombin Time: 13 seconds (ref 11.4–15.2)

## 2022-10-25 LAB — BRAIN NATRIURETIC PEPTIDE: B Natriuretic Peptide: 28.8 pg/mL (ref 0.0–100.0)

## 2022-10-25 MED ORDER — ACETAMINOPHEN 325 MG PO TABS
650.0000 mg | ORAL_TABLET | Freq: Four times a day (QID) | ORAL | Status: DC | PRN
  Administered 2022-10-25 – 2022-10-26 (×3): 650 mg via ORAL
  Filled 2022-10-25 (×3): qty 2

## 2022-10-25 MED ORDER — INSULIN ASPART 100 UNIT/ML IJ SOLN: 0.0000 [IU] | Freq: Every day | INTRAMUSCULAR | Status: DC

## 2022-10-25 MED ORDER — FUROSEMIDE 10 MG/ML IJ SOLN
40.0000 mg | Freq: Once | INTRAMUSCULAR | Status: AC
  Administered 2022-10-25: 40 mg via INTRAVENOUS
  Filled 2022-10-25: qty 4

## 2022-10-25 MED ORDER — RIVAROXABAN 10 MG PO TABS
10.0000 mg | ORAL_TABLET | Freq: Every day | ORAL | Status: DC
  Administered 2022-10-25 – 2022-10-27 (×3): 10 mg via ORAL
  Filled 2022-10-25 (×3): qty 1

## 2022-10-25 MED ORDER — INSULIN ASPART 100 UNIT/ML IJ SOLN
0.0000 [IU] | Freq: Three times a day (TID) | INTRAMUSCULAR | Status: DC
  Administered 2022-10-26 – 2022-10-27 (×2): 2 [IU] via SUBCUTANEOUS

## 2022-10-25 MED ORDER — ACETAMINOPHEN 650 MG RE SUPP: 650.0000 mg | Freq: Four times a day (QID) | RECTAL | Status: DC | PRN

## 2022-10-25 MED ORDER — LISINOPRIL 20 MG PO TABS
10.0000 mg | ORAL_TABLET | Freq: Every day | ORAL | Status: DC
  Administered 2022-10-25 – 2022-10-27 (×3): 10 mg via ORAL
  Filled 2022-10-25 (×3): qty 1

## 2022-10-25 NOTE — Hospital Course (Addendum)
#  Acute on chronic HFpEF #Lower extremity edema Patient's symptoms of worsening lower extremity swelling and dyspnea on exertion is most concerning for CHF exacerbation, particularly in setting of medication non-adherence for possible history of heart failure. Labs were reassuring (normal BMP, BNP, troponin). No signs of pulmonary edema on CXR, which correlates with patient's normal work of breathing on room air. On exam, the patient had 1+ pitting edema to the level of both knees, which improved after 2 days of IV diuresis. Bilateral lower extremity ultrasound was initially obtained in the ED and ruled out DVTs. Echocardiogram revealed an EF of >75%, mild LVH and normal LV diastolic parameters, although clinically, it appears that the patient has HFpEF. Will discharge with po lasix 40 mg daily.  #Hypertension  The patient was previously on lisinopril 10 mg daily, however, had been off of this for years. Restarted lisinopril in the setting of elevated BP.   #Delusional disorder  Patient's thought content demonstrated possible delusions with reference to "white house" and "FBI". No visual, auditory hallucinations noted. Presentation is most concerning for delusional disorder though timeline of patient's symptoms is not known. Psychiatry was consulted and started the patient on risperidal 0.5 mg qhs PRN. Recommend outpatient psychiatry follow up. She does not pose an imminent risk to self or others and does not meet inpatient psychiatric admission criteria.

## 2022-10-25 NOTE — ED Provider Notes (Signed)
[place] EMERGENCY DEPARTMENT AT [facility] Provider Note   CSN: [id number] Arrival date & time: 10/25/22  1002     History  Chief Complaint  Patient presents with   Leg Swelling    Alexandria Schultz is a 67 y.o. female.  Patient with diabetes, hypertension, glaucoma, hyperlipidemia, CHF [facility] from homeless shelter with ongoing leg swelling.  Told nursing staff is been ongoing for several years but admits that has been progressively worsening over the past several weeks.  Causing her pain and burning when she walks around.  Has had increased shortness of breath and difficulty lying flat as well.  States she does have a history of CHF but does not take any medications for it and has not been on any medications for several years.  Cough productive of clear mucus.  No fever.  No chest pain currently.  States her weight is gone up as well and she is having increased pain, burning and swelling to her legs.  Does not know the last time she took medications.  States she was on diuretics in the past.  Also has had an MI in the past but does not know if she has stents.  She feels like her weight is increased compared to her baseline.  Denies any history of blood clots. Able to walk but having much pain and difficulty with walking due to burning sensation in her legs.  The history is provided by the patient.       Home Medications Prior to Admission medications   Medication Sig Start Date End Date Taking? Authorizing Provider  atorvastatin (LIPITOR) 10 MG tablet Take 10 mg by mouth.  06/09/12   [provider]  clonazePAM (KLONOPIN) 0.5 MG tablet Take 1 tablet (0.5 mg total) by mouth 3 (three) times daily. 05/03/15 05/02/16  Myrlene Broker, MD  lisinopril (PRINIVIL,ZESTRIL) 10 MG tablet Take 10 mg by mouth daily.  06/16/12   [provider]  metFORMIN (GLUCOPHAGE) 500 MG tablet Take 500 mg by mouth daily with breakfast.  07/13/12   [provider]   risperiDONE (RISPERDAL) 1 MG tablet Take 1 tablet (1 mg total) by mouth at bedtime. 06/28/15 06/27/16  Myrlene Broker, MD  timolol (TIMOPTIC) 0.5 % ophthalmic solution Place 1 drop into both eyes daily.  06/23/12   [provider]  TRAVATAN Z 0.004 % SOLN ophthalmic solution Place 1 drop into both eyes at bedtime.  06/22/12   [provider]  Vitamin D, Ergocalciferol, (DRISDOL) 50000 UNITS CAPS Take 50,000 Units by mouth every 7 (seven) days.  05/19/12   [provider]      Allergies    Ibuprofen    Review of Systems   Review of Systems  Constitutional:  Positive for activity change and appetite change. Negative for fever.  HENT:  Negative for congestion and rhinorrhea.   Respiratory:  Positive for shortness of breath. Negative for chest tightness.   Cardiovascular:  Positive for leg swelling. Negative for chest pain.  Gastrointestinal:  Negative for abdominal pain, nausea and vomiting.  Genitourinary:  Negative for dysuria and hematuria.  Musculoskeletal:  Positive for arthralgias and myalgias.  Skin:  Negative for rash.  Neurological:  Positive for weakness. Negative for dizziness and headaches.   all other systems are negative except as noted in the HPI and PMH.    Physical Exam Updated Vital Signs BP (!) 140/84   Pulse 75   Temp 98.4 F (36.9 C)   Resp  18   Ht 6\' 2"  (1.88 m)   Wt 127 kg   SpO2 100%   BMI 35.95 kg/m  Physical Exam Vitals and nursing note reviewed.  Constitutional:      General: She is not in acute distress.    Appearance: She is well-developed. She is not ill-appearing.  HENT:     Head: Normocephalic and atraumatic.     Mouth/Throat:     Pharynx: No oropharyngeal exudate.  Eyes:     Conjunctiva/sclera: Conjunctivae normal.     Pupils: Pupils are equal, round, and reactive to light.  Neck:     Comments: No meningismus. Cardiovascular:     Rate and Rhythm: Normal rate and regular rhythm.     Heart sounds: Normal heart  sounds. No murmur heard. Pulmonary:     Effort: Pulmonary effort is normal. No respiratory distress.     Breath sounds: Normal breath sounds.  Abdominal:     Palpations: Abdomen is soft.     Tenderness: There is no abdominal tenderness. There is no guarding or rebound.  Musculoskeletal:        General: No tenderness. Normal range of motion.     Cervical back: Normal range of motion and neck supple.     Right lower leg: Edema present.     Left lower leg: Edema present.     Comments: +3 edema to knees bilaterally\ Intact DP pulses Tight edema to legs.  Skin:    General: Skin is warm.     Capillary Refill: Capillary refill takes less than 2 seconds.  Neurological:     General: No focal deficit present.     Mental Status: She is alert and oriented to person, place, and time. Mental status is at baseline.     Cranial Nerves: No cranial nerve deficit.     Motor: No abnormal muscle tone.     Coordination: Coordination normal.     Gait: Gait normal.     Comments:  5/5 strength throughout. CN 2-12 intact.Equal grip strength.   Psychiatric:        Behavior: Behavior normal.     ED Results / Procedures / Treatments   Labs (all labs ordered are listed, but only abnormal results are displayed) Labs Reviewed  COMPREHENSIVE METABOLIC PANEL - Abnormal; Notable for the following components:      Result Value   Glucose, Bld 177 (*)    Albumin 3.4 (*)    All other components within normal limits  URINALYSIS, ROUTINE W REFLEX MICROSCOPIC - Abnormal; Notable for the following components:   Color, Urine STRAW (*)    All other components within normal limits  CBC WITH DIFFERENTIAL/PLATELET  BRAIN NATRIURETIC PEPTIDE  PROTIME-INR  TROPONIN I (HIGH SENSITIVITY)  TROPONIN I (HIGH SENSITIVITY)    EKG EKG Interpretation  Date/Time:  Thursday Oct 25 2022 10:41:11 EDT Ventricular Rate:  79 PR Interval:  188 QRS Duration: 136 QT Interval:  448 QTC Calculation: 513 R Axis:   -23 Text  Interpretation: Normal sinus rhythm Right bundle branch block Abnormal ECG No previous ECGs available No previous ECGs available Confirmed by Glynn Octave [phone]) on 10/25/2022 10:54:17 AM  Radiology VAS [place] LOWER EXTREMITY VENOUS (DVT) (ONLY [facility] & [facility])  Result Date: 10/25/2022  Lower Venous DVT Study Patient Name:  SHAWNAE LEIVA  Date of Exam:   10/25/2022 Medical Rec #: [id number]      Accession #:    [id number] Date of Birth: December 01, 1955  Patient Gender: F Patient Age:   46 years Exam Location:  [facility] Procedure:      VAS [place] LOWER EXTREMITY VENOUS (DVT) Referring Phys: Glynn Octave --------------------------------------------------------------------------------  Indications: Edema.  Limitations: Poor ultrasound/tissue interface, body habitus and subcutaneous edema. Comparison Study: No previous exams Performing Technologist: Jody Hill RVT, RDMS  Examination Guidelines: A complete evaluation includes B-mode imaging, spectral Doppler, color Doppler, and power Doppler as needed of all accessible portions of each vessel. Bilateral testing is considered an integral part of a complete examination. Limited examinations for reoccurring indications may be performed as noted. The reflux portion of the exam is performed with the patient in reverse Trendelenburg.  +---------+---------------+---------+-----------+----------+-------------------+ RIGHT    CompressibilityPhasicitySpontaneityPropertiesThrombus Aging      +---------+---------------+---------+-----------+----------+-------------------+ CFV      Full           Yes      Yes                                      +---------+---------------+---------+-----------+----------+-------------------+ SFJ      Full                                                             +---------+---------------+---------+-----------+----------+-------------------+ FV Prox  Full           Yes      Yes                                       +---------+---------------+---------+-----------+----------+-------------------+ FV Mid   Full           Yes      Yes                                      +---------+---------------+---------+-----------+----------+-------------------+ FV DistalFull           Yes      Yes                                      +---------+---------------+---------+-----------+----------+-------------------+ PFV      Full                                                             +---------+---------------+---------+-----------+----------+-------------------+ POP      Full           Yes      Yes                                      +---------+---------------+---------+-----------+----------+-------------------+ PTV  Not well visualized +---------+---------------+---------+-----------+----------+-------------------+ PERO                                                  Not well visualized +---------+---------------+---------+-----------+----------+-------------------+   +---------+---------------+---------+-----------+----------+-------------------+ LEFT     CompressibilityPhasicitySpontaneityPropertiesThrombus Aging      +---------+---------------+---------+-----------+----------+-------------------+ CFV      Full           Yes      Yes                                      +---------+---------------+---------+-----------+----------+-------------------+ SFJ      Full                                                             +---------+---------------+---------+-----------+----------+-------------------+ FV Prox  Full           Yes      Yes                                      +---------+---------------+---------+-----------+----------+-------------------+ FV Mid   Full           Yes      Yes                                      +---------+---------------+---------+-----------+----------+-------------------+ FV  DistalFull           Yes      Yes                                      +---------+---------------+---------+-----------+----------+-------------------+ PFV      Full                                                             +---------+---------------+---------+-----------+----------+-------------------+ POP      Full           Yes      Yes                                      +---------+---------------+---------+-----------+----------+-------------------+ PTV      Full                                                             +---------+---------------+---------+-----------+----------+-------------------+ PERO     Full  Not well visualized +---------+---------------+---------+-----------+----------+-------------------+     Summary: BILATERAL: -No evidence of popliteal cyst, bilaterally. -Diffuse subcutaneous edema, bilaterally. RIGHT: - There is no evidence of deep vein thrombosis in the lower extremity. However, portions of this examination were limited- see technologist comments above.  LEFT: - There is no evidence of deep vein thrombosis in the lower extremity. However, portions of this examination were limited- see technologist comments above.  *See table(s) above for measurements and observations.    Preliminary    DG Chest 2 View  Result Date: 10/25/2022 CLINICAL DATA:  Shortness of breath EXAM: CHEST - 2 VIEW COMPARISON:  X-ray 09/22/2019 report only.  Images are not available FINDINGS: Normal cardiopericardial silhouette with calcified tortuous aorta. No consolidation, pneumothorax or effusion. No edema. Mild interstitial prominence. This could be chronic. IMPRESSION: Tortuous ectatic aorta. Interstitial prominence. This could be chronic but please correlate with any prior imaging. Short follow-up may also be useful Electronically Signed   By: Karen Kays M.D.   On: 10/25/2022 11:18    Procedures Procedures    Medications  Ordered in ED Medications - No data to display  ED Course/ Medical Decision Making/ A&P                             Medical Decision Making Amount and/or Complexity of Data Reviewed Independent Historian: EMS Labs: ordered. Decision-making details documented in ED Course. Radiology: ordered and independent interpretation performed. Decision-making details documented in ED Course. ECG/medicine tests: ordered and independent interpretation performed. Decision-making details documented in ED Course.  Risk Prescription drug management. Decision regarding hospitalization.   Acute on chronic lower extremity edema.  Some subjective shortness of breath.  No hypoxia or increased work of breathing.  Labs and CXR to be obtained.  Labs show hyperglycemia without evidence of DKA. Creatinine normal,   No echocardiogram in system. CXR with interstitial prominence and ectatic aorta. Results reviewed and interpreted by me.  Suspect peripheral edema, possible new onset CHF but no hypoxia or increased work of breathing. Lower suspicion for DVT but will check Doppler. Will diuresis with lasix.  Given difficulty with ambulation and lack of followup, plan observation admission for further diuresis and echo and further evaluation.  D/w internal medicine residents.        Final Clinical Impression(s) / ED Diagnoses Final diagnoses:  Peripheral edema    Rx / DC Orders ED Discharge Orders     None         Gavriela Cashin, Jeannett Senior, MD 10/25/22 1702

## 2022-10-25 NOTE — ED Triage Notes (Signed)
Alexandria Schultz is a 67 y.o. female arrived via ems from homeless shelter.  Pt reports swelling to lower extremities x 4 years but reports "its hard to get around" Pt states that she has hx of diabetes htn had some "heart surgery" and smokes "weeds" for her glaucoma Pt reports that she does not take any medication,  Pt states that she is able to walk but her legs "burn"

## 2022-10-25 NOTE — Plan of Care (Signed)

## 2022-10-25 NOTE — ED Notes (Signed)
Labs sent pt encouraged to provide urine sample

## 2022-10-25 NOTE — ED Notes (Signed)
ED TO INPATIENT HANDOFF REPORT  ED Nurse Name and Phone #: Alexandria Schultz [phone]  S Name/Age/Gender Alexandria Schultz 67 y.o. female Room/Bed: H015C/H015C  Code Status   Code Status: Full Code  Home/SNF/Other Home Patient oriented to: self, place, time, and situation Is this baseline? Yes   Triage Complete: Triage complete  Chief Complaint Bilateral lower extremity edema [R60.0]  Triage Note Alexandria Schultz is a 67 y.o. female arrived via ems from homeless shelter.  Pt reports swelling to lower extremities x 4 years but reports "its hard to get around" Pt states that she has hx of diabetes htn had some "heart surgery" and smokes "weeds" for her glaucoma Pt reports that she does not take any medication,  Pt states that she is able to walk but her legs "burn"   Allergies Allergies  Allergen Reactions   Ibuprofen     Chest pain    Level of Care/Admitting Diagnosis ED Disposition     ED Disposition  Admit   Condition  --   Comment  Hospital Area: [facility] [[id number]]  Level of Care: Med-Surg [16]  May place patient in observation at [facility] or [facility] if equivalent level of care is available:: No  Covid Evaluation: Asymptomatic - no recent exposure (last 10 days) testing not required  Diagnosis: Bilateral lower extremity edema [720947]  Admitting Physician: Tyson Alias [[id number]]  Attending Physician: Tyson Alias [phone]          B Medical/Surgery History Past Medical History:  Diagnosis Date   Diabetes mellitus, type II (HCC)    GERD (gastroesophageal reflux disease)    Glaucoma    Hyperlipidemia    Hypertension    Psychosis (HCC)    Past Surgical History:  Procedure Laterality Date   ABDOMINAL HYSTERECTOMY       A IV Location/Drains/Wounds Patient Lines/Drains/Airways Status     Active Line/Drains/Airways     Name Placement date Placement time Site Days   Peripheral IV 10/25/22 Anterior;Proximal;Right  Forearm 10/25/22  1300  Forearm  less than 1            Intake/Output Last 24 hours No intake or output data in the 24 hours ending 10/25/22 1612  Labs/Imaging Results for orders placed or performed during the hospital encounter of 10/25/22 (from the past 48 hour(s))  CBC with Differential     Status: None   Collection Time: 10/25/22 10:58 AM  Result Value Ref Range   WBC 4.4 4.0 - 10.5 K/uL   RBC 4.03 3.87 - 5.11 MIL/uL   Hemoglobin 12.8 12.0 - 15.0 g/dL   HCT 30.8 65.7 - 84.6 %   MCV 94.8 80.0 - 100.0 fL   MCH 31.8 26.0 - 34.0 pg   MCHC 33.5 30.0 - 36.0 g/dL   RDW 96.2 95.2 - 84.1 %   Platelets 231 150 - 400 K/uL   nRBC 0.0 0.0 - 0.2 %   Neutrophils Relative % 52 %   Neutro Abs 2.3 1.7 - 7.7 K/uL   Lymphocytes Relative 35 %   Lymphs Abs 1.5 0.7 - 4.0 K/uL   Monocytes Relative 9 %   Monocytes Absolute 0.4 0.1 - 1.0 K/uL   Eosinophils Relative 2 %   Eosinophils Absolute 0.1 0.0 - 0.5 K/uL   Basophils Relative 1 %   Basophils Absolute 0.1 0.0 - 0.1 K/uL   Immature Granulocytes 1 %   Abs Immature Granulocytes 0.03 0.00 - 0.07 K/uL  Comment: Performed at [facility], [address] [place], [place] [address]  Comprehensive metabolic panel     Status: Abnormal   Collection Time: 10/25/22 10:58 AM  Result Value Ref Range   Sodium 137 135 - 145 mmol/L   Potassium 4.2 3.5 - 5.1 mmol/L   Chloride 102 98 - 111 mmol/L   CO2 26 22 - 32 mmol/L   Glucose, Bld 177 (H) 70 - 99 mg/dL    Comment: Glucose reference range applies only to samples taken after fasting for at least 8 hours.   BUN 10 8 - 23 mg/dL   Creatinine, Ser 4.09 0.44 - 1.00 mg/dL   Calcium 9.0 8.9 - 81.1 mg/dL   Total Protein 6.8 6.5 - 8.1 g/dL   Albumin 3.4 (L) 3.5 - 5.0 g/dL   AST 19 15 - 41 U/L   ALT 19 0 - 44 U/L   Alkaline Phosphatase 78 38 - 126 U/L   Total Bilirubin 0.6 0.3 - 1.2 mg/dL   GFR, Estimated >91 >47 mL/min    Comment: (NOTE) Calculated using the CKD-EPI Creatinine Equation  (2021)    Anion gap 9 5 - 15    Comment: Performed at [facility], [address], [place], [place] [address]  Brain natriuretic peptide     Status: None   Collection Time: 10/25/22 10:58 AM  Result Value Ref Range   B Natriuretic Peptide 28.8 0.0 - 100.0 pg/mL    Comment: Performed at [facility], [address], [place], [place] [address]  Troponin I (High Sensitivity)     Status: None   Collection Time: 10/25/22 10:58 AM  Result Value Ref Range   Troponin I (High Sensitivity) 3 <18 ng/L    Comment: (NOTE) Elevated high sensitivity troponin I (hsTnI) values and significant  changes across serial measurements may suggest ACS but many other  chronic and acute conditions are known to elevate hsTnI results.  Refer to the "Links" section for chest pain algorithms and additional  guidance. Performed at [facility], [address], [place], [place] [address]   Protime-INR     Status: None   Collection Time: 10/25/22 10:58 AM  Result Value Ref Range   Prothrombin Time 13.0 11.4 - 15.2 seconds   INR 1.0 0.8 - 1.2    Comment: (NOTE) INR goal varies based on device and disease states. Performed at [facility], [address], [place], [place] [address]   Troponin I (High Sensitivity)     Status: None   Collection Time: 10/25/22 12:20 PM  Result Value Ref Range   Troponin I (High Sensitivity) 3 <18 ng/L    Comment: (NOTE) Elevated high sensitivity troponin I (hsTnI) values and significant  changes across serial measurements may suggest ACS but many other  chronic and acute conditions are known to elevate hsTnI results.  Refer to the "Links" section for chest pain algorithms and additional  guidance. Performed at [facility], [address] [place], [place] [address]   Urinalysis, Routine w reflex microscopic -Urine, Clean Catch     Status: Abnormal   Collection Time: 10/25/22  2:41 PM  Result Value Ref Range   Color, Urine STRAW (A) YELLOW    APPearance CLEAR CLEAR   Specific Gravity, Urine 1.006 1.005 - 1.030   pH 6.0 5.0 - 8.0   Glucose, UA NEGATIVE NEGATIVE mg/dL   Hgb urine dipstick NEGATIVE NEGATIVE   Bilirubin Urine  NEGATIVE NEGATIVE   Ketones, ur NEGATIVE NEGATIVE mg/dL   Protein, ur NEGATIVE NEGATIVE mg/dL   Nitrite NEGATIVE NEGATIVE   Leukocytes,Ua NEGATIVE NEGATIVE    Comment: Performed at [facility], [address], [place], [place] [address]   DG Chest 2 View  Result Date: 10/25/2022 CLINICAL DATA:  Shortness of breath EXAM: CHEST - 2 VIEW COMPARISON:  X-ray 09/22/2019 report only.  Images are not available FINDINGS: Normal cardiopericardial silhouette with calcified tortuous aorta. No consolidation, pneumothorax or effusion. No edema. Mild interstitial prominence. This could be chronic. IMPRESSION: Tortuous ectatic aorta. Interstitial prominence. This could be chronic but please correlate with any prior imaging. Short follow-up may also be useful Electronically Signed   By: Karen Kays M.D.   On: 10/25/2022 11:18    Pending Labs Unresulted Labs (From admission, onward)     Start     Ordered   10/26/22 0500  HIV Antibody (routine testing w rflx)  (HIV Antibody (Routine testing w reflex) panel)  Tomorrow morning,   R        10/25/22 1606   10/26/22 0500  Hemoglobin A1c  Tomorrow morning,   R        10/25/22 1606   10/26/22 0500  CBC  Tomorrow morning,   R        10/25/22 1606   10/26/22 0500  Basic metabolic panel  Tomorrow morning,   R        10/25/22 1606            Vitals/Pain Today's Vitals   10/25/22 1017 10/25/22 1030 10/25/22 1045 10/25/22 1404  BP: (!) 140/84 (!) 148/89  (!) 168/70  Pulse: 75 78 78 89  Resp: 18   18  Temp: 98.4 F (36.9 C)   98.2 F (36.8 C)  SpO2: 100% 100% 100% 100%  Weight: 127 kg     Height: 6\' 2"  (1.88 m)     PainSc: 10-Worst pain ever       Isolation Precautions No active isolations  Medications Medications  rivaroxaban (XARELTO) tablet 10 mg (has no  administration in time range)  acetaminophen (TYLENOL) tablet 650 mg (has no administration in time range)    Or  acetaminophen (TYLENOL) suppository 650 mg (has no administration in time range)  furosemide (LASIX) injection 40 mg (40 mg Intravenous Given 10/25/22 1354)    Mobility walks     Focused Assessments Cardiac Assessment Handoff:    No results found for: "CKTOTAL", "CKMB", "CKMBINDEX", "TROPONINI" No results found for: "DDIMER" Does the Patient currently have chest pain? No    R Recommendations: See Admitting Provider Note  Report given to:   Additional Notes: .

## 2022-10-25 NOTE — Progress Notes (Signed)
BLE venous duplex has been completed.  Preliminary results given to Dr. Manus Gunning.   Results can be found under chart review under CV PROC. 10/25/2022 4:39 PM Synai Prettyman RVT, RDMS

## 2022-10-25 NOTE — H&P (Signed)
Date: 10/25/2022               Patient Name:  Alexandria Schultz MRN: [id number]  DOB: 04/24/1956 Age / Sex: 67 y.o., female   PCP: Pcp, No              Medical Service: Internal Medicine Teaching Service              Attending Physician: Dr. Oswaldo Done, Marquita Palms, *    First Contact: Lyda Kalata, MS 4 Pager: [phone]  Second Contact: Dr. Elza Rafter  Pager: [phone]            After Hours (After 5p/  First Contact Pager: [phone]  weekends / holidays): Second Contact Pager: [phone]    SUBJECTIVE   Chief Complaint: Lower extremity swelling    History of Present Illness: Alexandria Schultz is a 67 yo female living with possible CHF, diabetes, glaucoma, and delusional disorder who presents from the [facility] for shortness of breath and worsening bilateral leg swelling for 3 weeks. History is limited as patient appears paranoid and did not want to answer questions that she was previously asked. During conversation, patient advised that we speak with the "white house" or "FBI" to get additional information on her medical history.   She does endorse worsening dyspnea on exertion and an intermittent cough. Patient states that she has been unable to walk due to the swelling and pain in her lower extremities, but while in the ED, she has been able to ambulate to restroom without issues and reports good urine output after receiving IV lasix. She states that she has burning in her feet that is worsened by ambulation. She denies orthopnea, chest pain, and palpitations, but does have right-sided breast pain. She denies any fevers, chills, abdominal pain, n/v/d.   ED Course: Received IV Lasix 40 mg (x1)  Meds:  - Goody powder 1-3x a day  - OTC inhaler  - Has been out of other medications, including eye drops and anti-HTNs (previously on Lipitor 10 mg daily, klonopin 0.5 mg TID, lisinopril 10 mg daily, metformin 500 mg daily, risperidone 1 mg qhs, timolol eye drops, travatan eye drops, vitamin D) - No  dispense reports since 10/2019  Past Medical History - Unclear as patient was not able to provide. Possible CHF, delusional disorder, paranoia, diabetes, glaucoma, HLD  Past Surgical History:  Procedure Laterality Date   ABDOMINAL HYSTERECTOMY      Social:  Patient is un-housed and lives at the [facility] in [place], [place]. She drinks alcohol almost daily (multiple beers). Drank four 16 oz beers as recently as yesterday. Used to smoke cigarettes x 40 yrs but does not currently use tobacco products. Patient smokes marijuana, no IV drug use. She does not have a PCP at this time.    Family History:  - Both parents are deceased. Father passed from stomach cancer. Mother's history is unknown.  - She has some siblings but is unsure of their medical history.    Allergies: Allergies as of 10/25/2022 - Review Complete 10/25/2022  Allergen Reaction Noted   Ibuprofen  05/01/2013    Review of Systems: A complete ROS was negative except as per HPI.   OBJECTIVE:   Physical Exam: Blood pressure (!) 168/70, pulse 89, temperature 98.2 F (36.8 C), resp. rate 18, height 6\' 2"  (1.88 m), weight 127 kg, SpO2 100 %.   Constitutional: well-appearing, conversational, sitting in hospital bed in ED, no acute distress Eyes: conjunctiva non-erythematous Cardiovascular: regular rate and  rhythm, no m/r/g, no S3  Pulmonary/Chest: normal work of breathing on room air, lungs clear to auscultation bilaterally without wheezing or crackles Abdominal: soft, non-distended, mild discomfort to palpation of RUQ  MSK: trace non-pitting edema of ankles/feet, bilaterally; trace dependent edema around heel/malleolus, bilaterally  Skin: warm and dry Psych: tangential speech; impaired memory; thought content is paranoid, delusional   Labs: CBC    Component Value Date/Time   WBC 4.4 10/25/2022 1058   RBC 4.03 10/25/2022 1058   HGB 12.8 10/25/2022 1058   HCT 38.2 10/25/2022 1058   PLT 231 10/25/2022 1058   MCV 94.8  10/25/2022 1058   MCH 31.8 10/25/2022 1058   MCHC 33.5 10/25/2022 1058   RDW 12.5 10/25/2022 1058   LYMPHSABS 1.5 10/25/2022 1058   MONOABS 0.4 10/25/2022 1058   EOSABS 0.1 10/25/2022 1058   BASOSABS 0.1 10/25/2022 1058     CMP     Component Value Date/Time   NA 137 10/25/2022 1058   K 4.2 10/25/2022 1058   CL 102 10/25/2022 1058   CO2 26 10/25/2022 1058   GLUCOSE 177 (H) 10/25/2022 1058   BUN 10 10/25/2022 1058   CREATININE 0.67 10/25/2022 1058   CALCIUM 9.0 10/25/2022 1058   PROT 6.8 10/25/2022 1058   ALBUMIN 3.4 (L) 10/25/2022 1058   AST 19 10/25/2022 1058   ALT 19 10/25/2022 1058   ALKPHOS 78 10/25/2022 1058   BILITOT 0.6 10/25/2022 1058   GFRNONAA >60 10/25/2022 1058   BNP: 28.8  Troponin I: 3 PT (INR): 13 (1.0)  U/A: unremarkable   Imaging: - Cardiac echo: pending  - Chest X-ray: enlarged cardiac silhouette, bilateral opacities, no pulmonary edema (costophrenic angle not blunted)  EKG: personally reviewed my interpretation is right bundle branch blocks. Prior EKG not available.   ASSESSMENT & PLAN:   Assessment & Plan by Problem: Principal Problem:   Bilateral lower extremity edema  Alexandria Schultz is a 67 y.o. person living with a history of possible CHF, diabetes, hypertension, and delusional disorder who presents with shortness of breath and lower extremity swelling, admitted for rule out of CHF exacerbation.   #Rule-out CHF exacerbation  #Lower extremity edema Patient's symptoms of worsening lower extremity swelling and dyspnea on exertion is most concerning for CHF exacerbation, particularly in setting of medication non-adherence for possible history of heart failure. Labs are reassuring (normal BMP, BNP, troponin). Patient's breathing and lower extremity edema have also have improved after receiving IV lasix 40 mg in ED. She notes good urine output and no difficulty ambulating while in ED. On exam, patient is euvolemic (no pitting edema of lower  extremities, no bibasilar crackles). No signs of pulmonary edema on CXR, which correlates with patient's normal work of breathing on room air. Our concern for ongoing CHF exacerbation is low at this time. Bilateral lower extremity ultrasound was obtained and ruled out DVTs. Patient will benefit from admission for thorough cardiac assessment/work-up as well as outpatient medication management.  - Follow-up cardiac echo, establish baseline heart function  - Follow-up BMP  - Will hold off on further doses of IV lasix (no signs of volume overload)   - Strict I&Os, daily weights - PT/OT as able  #Hypertension BP in the 140-170s while in the ED.  - Start lisinopril 10 mg daily (previous home medication)  #Suspected delusional disorder  Patient's thought content demonstrated possible delusions with reference to "white house" and "FBI". No visual, auditory hallucinations noted. Presentation is most concerning for delusional disorder though  timeline of patient's symptoms is not known. She will likely benefit from psychiatry consult during admission with initiation of anti-psychotic medication and close outpatient follow-up.  #Hx of Diabetes  The patient reportedly has a history of diabetes and was previously on metformin 500 mg daily.  - Follow up A1c - CBG monitoring + sensitive SSI with mealas  Diet: Heart Healthy VTE:  Rivaroxaban IVF: None,None Code: Full (default)   Prior to Admission Living Arrangement: Homeless (lives at [facility]) Anticipated Discharge Location:  [facility] Barriers to Discharge: Medical management   Dispo: Admit patient to Observation with expected length of stay less than 2 midnights.  Signed: Lyda Kalata, MS4 Pager: [phone]  Attestation for Student Documentation:  I personally was present and performed or re-performed the history, physical exam and medical decision-making activities of this service and have verified that the service and findings are accurately  documented in the student's note.  Chauncey Mann, DO 10/25/2022, 5:56 PM After hours pager: [phone]

## 2022-10-25 NOTE — ED Notes (Signed)
Patient transported to x-ray. ?

## 2022-10-26 ENCOUNTER — Observation Stay (HOSPITAL_COMMUNITY): Payer: Medicare Other

## 2022-10-26 DIAGNOSIS — I4510 Unspecified right bundle-branch block: Secondary | ICD-10-CM | POA: Diagnosis present

## 2022-10-26 DIAGNOSIS — K219 Gastro-esophageal reflux disease without esophagitis: Secondary | ICD-10-CM | POA: Diagnosis present

## 2022-10-26 DIAGNOSIS — E119 Type 2 diabetes mellitus without complications: Secondary | ICD-10-CM | POA: Diagnosis present

## 2022-10-26 DIAGNOSIS — F411 Generalized anxiety disorder: Secondary | ICD-10-CM

## 2022-10-26 DIAGNOSIS — I110 Hypertensive heart disease with heart failure: Secondary | ICD-10-CM | POA: Diagnosis present

## 2022-10-26 DIAGNOSIS — F22 Delusional disorders: Secondary | ICD-10-CM | POA: Diagnosis present

## 2022-10-26 DIAGNOSIS — R600 Localized edema: Secondary | ICD-10-CM | POA: Diagnosis present

## 2022-10-26 DIAGNOSIS — I5033 Acute on chronic diastolic (congestive) heart failure: Secondary | ICD-10-CM

## 2022-10-26 DIAGNOSIS — F17290 Nicotine dependence, other tobacco product, uncomplicated: Secondary | ICD-10-CM | POA: Diagnosis present

## 2022-10-26 DIAGNOSIS — F29 Unspecified psychosis not due to a substance or known physiological condition: Secondary | ICD-10-CM | POA: Diagnosis not present

## 2022-10-26 DIAGNOSIS — Z91148 Patient's other noncompliance with medication regimen for other reason: Secondary | ICD-10-CM | POA: Diagnosis not present

## 2022-10-26 DIAGNOSIS — H409 Unspecified glaucoma: Secondary | ICD-10-CM | POA: Diagnosis present

## 2022-10-26 DIAGNOSIS — Z5901 Sheltered homelessness: Secondary | ICD-10-CM | POA: Diagnosis not present

## 2022-10-26 DIAGNOSIS — F1090 Alcohol use, unspecified, uncomplicated: Secondary | ICD-10-CM | POA: Diagnosis present

## 2022-10-26 DIAGNOSIS — Z79899 Other long term (current) drug therapy: Secondary | ICD-10-CM | POA: Diagnosis not present

## 2022-10-26 DIAGNOSIS — I252 Old myocardial infarction: Secondary | ICD-10-CM | POA: Diagnosis not present

## 2022-10-26 DIAGNOSIS — E785 Hyperlipidemia, unspecified: Secondary | ICD-10-CM | POA: Diagnosis present

## 2022-10-26 DIAGNOSIS — F1290 Cannabis use, unspecified, uncomplicated: Secondary | ICD-10-CM | POA: Diagnosis present

## 2022-10-26 DIAGNOSIS — I5031 Acute diastolic (congestive) heart failure: Secondary | ICD-10-CM

## 2022-10-26 DIAGNOSIS — Z5941 Food insecurity: Secondary | ICD-10-CM | POA: Diagnosis not present

## 2022-10-26 DIAGNOSIS — Z5982 Transportation insecurity: Secondary | ICD-10-CM | POA: Diagnosis not present

## 2022-10-26 DIAGNOSIS — Z7984 Long term (current) use of oral hypoglycemic drugs: Secondary | ICD-10-CM | POA: Diagnosis not present

## 2022-10-26 DIAGNOSIS — Z886 Allergy status to analgesic agent status: Secondary | ICD-10-CM | POA: Diagnosis not present

## 2022-10-26 DIAGNOSIS — M7989 Other specified soft tissue disorders: Secondary | ICD-10-CM | POA: Diagnosis present

## 2022-10-26 LAB — CBC
HCT: 40.8 % (ref 36.0–46.0)
Hemoglobin: 13.3 g/dL (ref 12.0–15.0)
MCH: 30.7 pg (ref 26.0–34.0)
MCHC: 32.6 g/dL (ref 30.0–36.0)
MCV: 94.2 fL (ref 80.0–100.0)
Platelets: 274 10*3/uL (ref 150–400)
RBC: 4.33 MIL/uL (ref 3.87–5.11)
RDW: 12.6 % (ref 11.5–15.5)
WBC: 5.6 10*3/uL (ref 4.0–10.5)
nRBC: 0 % (ref 0.0–0.2)

## 2022-10-26 LAB — GLUCOSE, CAPILLARY
Glucose-Capillary: 182 mg/dL — ABNORMAL HIGH (ref 70–99)
Glucose-Capillary: 107 mg/dL — ABNORMAL HIGH (ref 70–99)
Glucose-Capillary: 103 mg/dL — ABNORMAL HIGH (ref 70–99)
Glucose-Capillary: 193 mg/dL — ABNORMAL HIGH (ref 70–99)

## 2022-10-26 LAB — BASIC METABOLIC PANEL
Anion gap: 10 (ref 5–15)
BUN: 15 mg/dL (ref 8–23)
CO2: 25 mmol/L (ref 22–32)
Calcium: 9.3 mg/dL (ref 8.9–10.3)
Chloride: 103 mmol/L (ref 98–111)
Creatinine, Ser: 0.77 mg/dL (ref 0.44–1.00)
GFR, Estimated: >60 mL/min (ref >60)
Glucose, Bld: 123 mg/dL — ABNORMAL HIGH (ref 70–99)
Potassium: 4 mmol/L (ref 3.5–5.1)
Sodium: 138 mmol/L (ref 135–145)

## 2022-10-26 LAB — ECHOCARDIOGRAM COMPLETE
AR max vel: 2.58 cm2
AV Area VTI: 2.79 cm2
AV Area mean vel: 2.63 cm2
AV Mean grad: 5.5 mmHg
AV Peak grad: 11.5 mmHg
Ao pk vel: 1.7 m/s
Area-P 1/2: 3.39 cm2
Est EF: >75
Height: 74 in
S' Lateral: 2.6 cm
Weight: 4451.53 oz

## 2022-10-26 LAB — HEMOGLOBIN A1C
Hgb A1c MFr Bld: 6.4 % — ABNORMAL HIGH (ref 4.8–5.6)
Mean Plasma Glucose: 136.98 mg/dL

## 2022-10-26 LAB — HIV ANTIBODY (ROUTINE TESTING W REFLEX): HIV Screen 4th Generation wRfx: NONREACTIVE

## 2022-10-26 MED ORDER — RISPERIDONE 0.5 MG PO TABS
0.5000 mg | ORAL_TABLET | Freq: Every evening | ORAL | Status: DC | PRN
  Administered 2022-10-27: 0.5 mg via ORAL
  Filled 2022-10-26 (×2): qty 1

## 2022-10-26 MED ORDER — FUROSEMIDE 10 MG/ML IJ SOLN
40.0000 mg | Freq: Once | INTRAMUSCULAR | Status: AC
  Administered 2022-10-26: 40 mg via INTRAVENOUS
  Filled 2022-10-26: qty 4

## 2022-10-26 NOTE — Consult Note (Signed)
[facility] Face-to-Face Psychiatry Consult   Reason for Consult:  Paranoid, delusional disorder, off meds Referring Physician:  Dr. Peterson Lombard Patient Identification: Alexandria Schultz MRN:  [id number] Principal Diagnosis: Acute on chronic heart failure with preserved ejection fraction (HFpEF) (HCC) Diagnosis:  Principal Problem:   Acute on chronic heart failure with preserved ejection fraction (HFpEF) (HCC) Active Problems:   Psychosis (HCC)   Anxiety state   Total Time spent with patient: 1 hour  Subjective:   Alexandria Schultz is a 67 y.o. female patient admitted with CHF exacerbation. She does endorse a history of delusions and seems to be familiar with the medication name Risperdal. Since retiring from [employer] after 30+ years she has resided at the [facility]. She reports being married and familiar with the [place] area, which is why she chose to relocate here. She denies having any biological children and on social security.   In regards to reason for consult patient said " speak with [facility] or FBI to get my medical history."   is most consistent with delusional disorder due to nonbizarre delusions that have been present for years. Her delusions could be related to substance as well which would be consistent with substance induced mood disorder. Patient reports use of alcohol and THC daily, however urine drug screen not obtained at the time of this evaluation. She has never met criteria for schizophrenia after reviewing chart. Today she does mention contacting us Secretary for her medical records when assessing past psychiatric history. She does present with linear and coherent thoughts. She is alert and oriented x 3, calm and cooperative. She is observed to be ambulating around the room with no difficulty. She denies suicidal ideation, homicidal ideation, and hallucinations at this time.   Her current presentation of persistent and ongoing substance use despite medical and psychosocial consequences  (diabetes, CHF,, familial disruption), minimizing use, desire to cut down use with previous unsuccessful attempts, impaired control over use, cravings, and evidence of tolerance are consistent with substance use disorder, moderate. Additionally, while substance use is likely contributing to symptoms of delusions, it is felt that patient also meets criteria for comorbid delusional disorder given severity and persistence of symptoms.  Patient also endorses history of trauma with associated memories to past traumatic events and flashbacks, although denies symptoms at this time.  Although patient carries historical diagnosis of delusional disorder, there is low suspicion for schizophrenia at this time as patient denies history of disorganized speech, hallucinations, or negative symptoms. Patient endorses inconsistent adherence with medications impacted by substance use and limited insight into mental health. " I dont feel like I need that. Im not going to take it. Nothing wrong with my mental health." She denies the desire to seek outpatient psychiatric services or start psychotropic medications at this time. She denies suicidality currently and it is not felt that she meets criteria for involuntary commitment at this time or criteria for NEFM.    Today, patient reports reasonable level of anxiety related to discharge. No acute safety concerns on interview. Reports sleeping well without nightmares last night; Otherwise, no changes to plan of care at this time.  She denies any acute symptoms of mania at this time to include impulsivity, grandiosity, mood lability, hypersexuality.  She does not present with any of the above symptoms on this evaluation.  She further denies any depressive symptoms to include anhedonia, hopelessness, worthlessness, guilty, suicidal.  She denies any acute psychosis, paranoia.  She does not appear to be displaying any  or responding to internal stimuli, external stimuli, or exhibiting  delusional thought disorder.  Patient denies any access to weapons, denies any alcohol and or substance abuse.  She reports moderate sleep and fair appetite.  Patient denies any auditory and/or visual hallucinations, does not appear to be responding to internal or external stimuli.  There is no evidence of delusional thought content and patient appears to answer all questions appropriately.  At this time patient appears to be psychiatrically stable to discharge home, with support system services in place.      HPI:  Alexandria Schultz is a 67 y.o. person living with a history of possible CHF, diabetes, hypertension, and delusional disorder who presents with shortness of breath and lower extremity swelling, admitted for rule out of CHF exacerbation.  Currently, the patient notes reduced shortness of breath and no issues walking to the restroom. Also reports that leg swelling appear improved from baseline. States that she stays at the [facility] when it is open and sleeps on the floor. On other nights, she sleeps on the street. She feels as though her life is in danger on the streets and that people out there want to harm her.   Of note, she does not have a PCP at this time. Patient was previously seen by psychiatrist and is not interested in being evaluated by one during admission.     Past Psychiatric History: Delusional disorder. No history of inpatient hospitalizations. Previously seen Dr. Tenny Craw at [facility] outpatient, last visit in 2017. Patient denies any suicide attempts. No illicit substance use at this time.   Risk to Self:   Risk to Others:   Prior Inpatient Therapy:   Prior Outpatient Therapy:    Past Medical History:  Past Medical History:  Diagnosis Date   Diabetes mellitus, type II (HCC)    GERD (gastroesophageal reflux disease)    Glaucoma    Hyperlipidemia    Hypertension    Psychosis (HCC)     Past Surgical History:  Procedure Laterality Date   ABDOMINAL HYSTERECTOMY      Family History:  Family History  Problem Relation Age of Onset   ADD / ADHD Neg Hx    Alcohol abuse Neg Hx    Drug abuse Neg Hx    Bipolar disorder Neg Hx    Dementia Neg Hx    Depression Neg Hx    OCD Neg Hx    Paranoid behavior Neg Hx    Schizophrenia Neg Hx    Seizures Neg Hx    Sexual abuse Neg Hx    Physical abuse Neg Hx    Anxiety disorder Other    Anxiety disorder Paternal Aunt    Family Psychiatric  History: Anxiety disorder in paternal aunt Social History:  Social History   Substance and Sexual Activity  Alcohol Use Yes   Alcohol/week: 46.0 standard drinks of alcohol   Types: 46 Cans of beer per week   Comment: Patient cannot specify     Social History   Substance and Sexual Activity  Drug Use Yes   Types: Marijuana    Social History   Socioeconomic History   Marital status: Unknown    Spouse name: Not on file   Number of children: Not on file   Years of education: Not on file   Highest education level: Not on file  Occupational History   Not on file  Tobacco Use   Smoking status: Former    Types: Cigarettes  Quit date: 06/25/2000    Years since quitting: 22.3   Smokeless tobacco: Never  Vaping Use   Vaping Use: Every day   Substances: Nicotine, Flavoring  Substance and Sexual Activity   Alcohol use: Yes    Alcohol/week: 46.0 standard drinks of alcohol    Types: 46 Cans of beer per week    Comment: Patient cannot specify   Drug use: Yes    Types: Marijuana   Sexual activity: Not Currently  Other Topics Concern   Not on file  Social History Narrative   Not on file   Social Determinants of Health   Financial Resource Strain: Not on file  Food Insecurity: Food Insecurity Present (10/25/2022)   Hunger Vital Sign    Worried About Running Out of Food in the Last Year: Sometimes true    Ran Out of Food in the Last Year: Never true  Transportation Needs: Unmet Transportation Needs (10/25/2022)   PRAPARE - Scientist, research (physical sciences) (Medical): Yes    Lack of Transportation (Non-Medical): Yes  Physical Activity: Not on file  Stress: Not on file  Social Connections: Not on file   Additional Social History:    Allergies:   Allergies  Allergen Reactions   Ibuprofen     Chest pain    Labs:  Results for orders placed or performed during the hospital encounter of 10/25/22 (from the past 48 hour(s))  CBC with Differential     Status: None   Collection Time: 10/25/22 10:58 AM  Result Value Ref Range   WBC 4.4 4.0 - 10.5 K/uL   RBC 4.03 3.87 - 5.11 MIL/uL   Hemoglobin 12.8 12.0 - 15.0 g/dL   HCT 16.1 09.6 - 04.5 %   MCV 94.8 80.0 - 100.0 fL   MCH 31.8 26.0 - 34.0 pg   MCHC 33.5 30.0 - 36.0 g/dL   RDW 40.9 81.1 - 91.4 %   Platelets 231 150 - 400 K/uL   nRBC 0.0 0.0 - 0.2 %   Neutrophils Relative % 52 %   Neutro Abs 2.3 1.7 - 7.7 K/uL   Lymphocytes Relative 35 %   Lymphs Abs 1.5 0.7 - 4.0 K/uL   Monocytes Relative 9 %   Monocytes Absolute 0.4 0.1 - 1.0 K/uL   Eosinophils Relative 2 %   Eosinophils Absolute 0.1 0.0 - 0.5 K/uL   Basophils Relative 1 %   Basophils Absolute 0.1 0.0 - 0.1 K/uL   Immature Granulocytes 1 %   Abs Immature Granulocytes 0.03 0.00 - 0.07 K/uL    Comment: Performed at [facility], [address] [place], [place] [address]  Comprehensive metabolic panel     Status: Abnormal   Collection Time: 10/25/22 10:58 AM  Result Value Ref Range   Sodium 137 135 - 145 mmol/L   Potassium 4.2 3.5 - 5.1 mmol/L   Chloride 102 98 - 111 mmol/L   CO2 26 22 - 32 mmol/L   Glucose, Bld 177 (H) 70 - 99 mg/dL    Comment: Glucose reference range applies only to samples taken after fasting for at least 8 hours.   BUN 10 8 - 23 mg/dL   Creatinine, Ser 6.21 0.44 - 1.00 mg/dL   Calcium 9.0 8.9 - 30.8 mg/dL   Total Protein 6.8 6.5 - 8.1 g/dL   Albumin 3.4 (L) 3.5 - 5.0 g/dL   AST 19 15 - 41 U/L   ALT 19 0 - 44 U/L  Alkaline Phosphatase 78 38 - 126 U/L   Total Bilirubin 0.6 0.3 -  1.2 mg/dL   GFR, Estimated >40 >98 mL/min    Comment: (NOTE) Calculated using the CKD-EPI Creatinine Equation (2021)    Anion gap 9 5 - 15    Comment: Performed at [facility], [address], [place], [place] [address]  Brain natriuretic peptide     Status: None   Collection Time: 10/25/22 10:58 AM  Result Value Ref Range   B Natriuretic Peptide 28.8 0.0 - 100.0 pg/mL    Comment: Performed at [facility], [address], [place], [place] [address]  Troponin I (High Sensitivity)     Status: None   Collection Time: 10/25/22 10:58 AM  Result Value Ref Range   Troponin I (High Sensitivity) 3 <18 ng/L    Comment: (NOTE) Elevated high sensitivity troponin I (hsTnI) values and significant  changes across serial measurements may suggest ACS but many other  chronic and acute conditions are known to elevate hsTnI results.  Refer to the "Links" section for chest pain algorithms and additional  guidance. Performed at [facility], [address], [place], [place] [address]   Protime-INR     Status: None   Collection Time: 10/25/22 10:58 AM  Result Value Ref Range   Prothrombin Time 13.0 11.4 - 15.2 seconds   INR 1.0 0.8 - 1.2    Comment: (NOTE) INR goal varies based on device and disease states. Performed at [facility], [address] [place], [place] [address]   Troponin I (High Sensitivity)     Status: None   Collection Time: 10/25/22 12:20 PM  Result Value Ref Range   Troponin I (High Sensitivity) 3 <18 ng/L    Comment: (NOTE) Elevated high sensitivity troponin I (hsTnI) values and significant  changes across serial measurements may suggest ACS but many other  chronic and acute conditions are known to elevate hsTnI results.  Refer to the "Links" section for chest pain algorithms and additional  guidance. Performed at [facility], [address], [place], [place] [address]   Urinalysis, Routine w reflex microscopic -Urine, Clean Catch      Status: Abnormal   Collection Time: 10/25/22  2:41 PM  Result Value Ref Range   Color, Urine STRAW (A) YELLOW   APPearance CLEAR CLEAR   Specific Gravity, Urine 1.006 1.005 - 1.030   pH 6.0 5.0 - 8.0   Glucose, UA NEGATIVE NEGATIVE mg/dL   Hgb urine dipstick NEGATIVE NEGATIVE   Bilirubin Urine NEGATIVE NEGATIVE   Ketones, ur NEGATIVE NEGATIVE mg/dL   Protein, ur NEGATIVE NEGATIVE mg/dL   Nitrite NEGATIVE NEGATIVE   Leukocytes,Ua NEGATIVE NEGATIVE    Comment: Performed at [facility], [address], [place], [place] [address]  Glucose, capillary     Status: Abnormal   Collection Time: 10/25/22  9:26 PM  Result Value Ref Range   Glucose-Capillary 133 (H) 70 - 99 mg/dL    Comment: Glucose reference range applies only to samples taken after fasting for at least 8 hours.  HIV Antibody (routine testing w rflx)     Status: None   Collection Time: 10/26/22  4:04 AM  Result Value Ref Range   HIV Screen 4th Generation wRfx Non Reactive Non Reactive    Comment: Performed at [facility], [address], [place], [place] [address]  Hemoglobin A1c     Status: Abnormal  Collection Time: 10/26/22  4:04 AM  Result Value Ref Range   Hgb A1c MFr Bld 6.4 (H) 4.8 - 5.6 %    Comment: (NOTE) Pre diabetes:          5.7%-6.4%  Diabetes:              >6.4%  Glycemic control for   <7.0% adults with diabetes    Mean Plasma Glucose 136.98 mg/dL    Comment: Performed at [facility], [address], [place], [place] [address]  CBC     Status: None   Collection Time: 10/26/22  4:04 AM  Result Value Ref Range   WBC 5.6 4.0 - 10.5 K/uL   RBC 4.33 3.87 - 5.11 MIL/uL   Hemoglobin 13.3 12.0 - 15.0 g/dL   HCT 40.9 81.1 - 91.4 %   MCV 94.2 80.0 - 100.0 fL   MCH 30.7 26.0 - 34.0 pg   MCHC 32.6 30.0 - 36.0 g/dL   RDW 78.2 95.6 - 21.3 %   Platelets 274 150 - 400 K/uL   nRBC 0.0 0.0 - 0.2 %    Comment: Performed at [facility], [address] [place], [place] [address]  Basic  metabolic panel     Status: Abnormal   Collection Time: 10/26/22  4:04 AM  Result Value Ref Range   Sodium 138 135 - 145 mmol/L   Potassium 4.0 3.5 - 5.1 mmol/L   Chloride 103 98 - 111 mmol/L   CO2 25 22 - 32 mmol/L   Glucose, Bld 123 (H) 70 - 99 mg/dL    Comment: Glucose reference range applies only to samples taken after fasting for at least 8 hours.   BUN 15 8 - 23 mg/dL   Creatinine, Ser 8.46 0.44 - 1.00 mg/dL   Calcium 9.3 8.9 - 96.2 mg/dL   GFR, Estimated >95 >28 mL/min    Comment: (NOTE) Calculated using the CKD-EPI Creatinine Equation (2021)    Anion gap 10 5 - 15    Comment: Performed at [facility], [address], [place], [place] [address]  Glucose, capillary     Status: Abnormal   Collection Time: 10/26/22  7:49 AM  Result Value Ref Range   Glucose-Capillary 182 (H) 70 - 99 mg/dL    Comment: Glucose reference range applies only to samples taken after fasting for at least 8 hours.    Current Facility-Administered Medications  Medication Dose Route Frequency Provider Last Rate Last Admin   acetaminophen (TYLENOL) tablet 650 mg  650 mg Oral Q6H PRN Morene Crocker, MD   650 mg at 10/26/22 4010   Or   acetaminophen (TYLENOL) suppository 650 mg  650 mg Rectal Q6H PRN Morene Crocker, MD       insulin aspart (novoLOG) injection 0-5 Units  0-5 Units Subcutaneous QHS Atway, Rayann N, DO       insulin aspart (novoLOG) injection 0-9 Units  0-9 Units Subcutaneous TID WC Atway, Rayann N, DO   2 Units at 10/26/22 0858   lisinopril (ZESTRIL) tablet 10 mg  10 mg Oral Daily Atway, Rayann N, DO   10 mg at 10/26/22 1046   rivaroxaban (XARELTO) tablet 10 mg  10 mg Oral Daily Morene Crocker, MD   10 mg at 10/26/22 1047    Musculoskeletal: Strength & Muscle Tone: within normal limits Gait & Station: normal Patient leans: N/A   Psychiatric Specialty Exam:  Presentation  General Appearance:  Appropriate for Environment; Casual  Eye  Contact: Good  Speech: Clear and Coherent; Normal Rate  Speech Volume: Normal  Handedness: Right   Mood and Affect  Mood: Euthymic  Affect: Congruent; Appropriate   Thought Process  Thought Processes: Coherent; Linear  Descriptions of Associations:Intact  Orientation:Full (Time, Place and Person)  Thought Content:WDL  History of Schizophrenia/Schizoaffective disorder:No data recorded Duration of Psychotic Symptoms:No data recorded Hallucinations:Hallucinations: None  Ideas of Reference:None  Suicidal Thoughts:Suicidal Thoughts: No  Homicidal Thoughts:Homicidal Thoughts: No   Sensorium  Memory: Immediate Good; Recent Fair; Remote Good  Judgment: Fair  Insight: Good   Executive Functions  Concentration: Good  Attention Span: Fair  Recall: Good  Fund of Knowledge: Good  Language: Good   Psychomotor Activity  Psychomotor Activity: Psychomotor Activity: Normal   Assets  Assets: Communication Skills; Desire for Improvement; Financial Resources/Insurance; Transportation   Sleep  Sleep: Sleep: Good   Physical Exam: Physical Exam Vitals and nursing note reviewed.  Constitutional:      Appearance: Normal appearance. She is normal weight.  Skin:    Capillary Refill: Capillary refill takes less than 2 seconds.  Neurological:     General: No focal deficit present.     Mental Status: She is alert and oriented to person, place, and time. Mental status is at baseline.  Psychiatric:        Attention and Perception: Attention and perception normal.        Mood and Affect: Mood and affect normal.        Speech: Speech normal.        Behavior: Behavior normal. Behavior is cooperative.        Thought Content: Thought content is delusional Pharmacist, community).        Cognition and Memory: Cognition and memory normal.        Judgment: Judgment normal.    Review of Systems  Constitutional:  Positive for malaise/fatigue.       Weight gain   Cardiovascular:  Positive for leg swelling.  Psychiatric/Behavioral:  Positive for substance abuse (alcohol and THC). Negative for depression, hallucinations, memory loss and suicidal ideas. The patient is not nervous/anxious and does not have insomnia.   All other systems reviewed and are negative.  Blood pressure 114/87, pulse 75, temperature 97.8 F (36.6 C), temperature source Oral, resp. rate 16, height 6\' 2"  (1.88 m), weight 126.2 kg, SpO2 100 %. Body mass index is 35.72 kg/m.  Treatment Plan Summary: Plan .   Due to QTc prolongation and stable delusions, will not recommend starting antipsychotic at this time. If symptoms worsen or impede care, consider low dose Risperdal 0.5mg  po qhs prn.  -Offer substance abuse resources.  -Patient can be referred to [facility] for substance abuse programming or therapy for delusional disorder.   Labs reviewed appear to be within normal limits. Will may benefit from B12, TSH, and B1 considering her alcohol intake. EKG obtained on 05/02; QTc 513. UDS pending (ordered add-on) and BAL not obtained on admission.   Disposition: No evidence of imminent risk to self or others at present.   Patient does not meet criteria for psychiatric inpatient admission. Supportive therapy provided about ongoing stressors. Discussed crisis plan, support from social network, calling 911, coming to the Emergency Department, and calling Suicide Hotline.  Maryagnes Amos, FNP 10/26/2022 12:38 PM

## 2022-10-26 NOTE — Evaluation (Signed)
Occupational Therapy Evaluation Patient Details Name: Alexandria Schultz MRN: [id number] DOB: [birth year] Today's Date: 10/26/2022   History of Present Illness Pt is a 67 y/o female who presents with lower extremity edema and SOB. PMH significant for .   Clinical Impression   Pt admitted with the above diagnoses. At baseline pt is independent with ADLs. Pt appears to be at baseline with ADLs. Able to walk in the room independently with OT present. Provided educational handout on energy conservation as pertains to managing full ADL routine. No further OT needs indicated, OT signing off.    Recommendations for follow up therapy are one component of a multi-disciplinary discharge planning process, led by the attending physician.  Recommendations may be updated based on patient status, additional functional criteria and insurance authorization.   Assistance Recommended at Discharge None  Patient can return home with the following      Functional Status Assessment  Patient has not had a recent decline in their functional status  Equipment Recommendations  None recommended by OT    Recommendations for Other Services       Precautions / Restrictions        Mobility Bed Mobility Overal bed mobility: Independent                  Transfers Overall transfer level: Modified independent Equipment used: None                      Balance Overall balance assessment: No apparent balance deficits (not formally assessed)                                         ADL either performed or assessed with clinical judgement   ADL Overall ADL's : Independent;At baseline;Modified independent                                       General ADL Comments: Pt appears to be at baseline with ADLs. Provided education on energy conservation as related to managing full ADL routine.     Vision         Perception     Praxis      Pertinent Vitals/Pain Pain  Assessment Pain Assessment: Faces Faces Pain Scale: Hurts a little bit Pain Location: BLE Pain Descriptors / Indicators: Discomfort Pain Intervention(s): Monitored during session     Hand Dominance     Extremity/Trunk Assessment Upper Extremity Assessment Upper Extremity Assessment: Overall WFL for tasks assessed   Lower Extremity Assessment Lower Extremity Assessment: Defer to PT evaluation   Cervical / Trunk Assessment Cervical / Trunk Assessment: Normal   Communication Communication Communication: No difficulties   Cognition Arousal/Alertness: Awake/alert Behavior During Therapy: WFL for tasks assessed/performed Overall Cognitive Status: No family/caregiver present to determine baseline cognitive functioning                                 General Comments: answered questions appropriately this session. some tangential responses.     General Comments       Exercises     Shoulder Instructions      Home Living Family/patient expects to be discharged to:: Shelter/Homeless  Home Equipment: Cane - single point          Prior Functioning/Environment Prior Level of Function : Independent/Modified Independent                        OT Problem List:        OT Treatment/Interventions:      OT Goals(Current goals can be found in the care plan section)    OT Frequency:      Co-evaluation              AM-PAC OT "6 Clicks" Daily Activity     Outcome Measure Help from another person eating meals?: None Help from another person taking care of personal grooming?: None Help from another person toileting, which includes using toliet, bedpan, or urinal?: None Help from another person bathing (including washing, rinsing, drying)?: None Help from another person to put on and taking off regular upper body clothing?: None Help from another person to put on and taking off regular lower body clothing?:  None 6 Click Score: 24   End of Session    Activity Tolerance: Patient tolerated treatment well Patient left: in bed;with call bell/phone within reach  OT Visit Diagnosis: Unsteadiness on feet (R26.81)                Time: 9811-9147 OT Time Calculation (min): 12 min Charges:  OT General Charges $OT Visit: 1 Visit OT Evaluation $OT Eval Low Complexity: 1 Low  Raynald Kemp, OT Acute Rehabilitation Services Office: [phone]   [name] 10/26/2022, 12:25 PM

## 2022-10-26 NOTE — Evaluation (Signed)
Physical Therapy Evaluation  Patient Details Name: Alexandria Schultz MRN: [id number] DOB: 14-Feb-1956 Today's Date: 10/26/2022  History of Present Illness  Pt is a 67 y/o female who presents 10/25/22 with lower extremity edema and SOB. PMH significant for DM, glaucoma, HTN, psychosis.   Clinical Impression  Patient evaluated by Physical Therapy with no further acute PT needs identified. All education has been completed and the patient has no further questions. At the time of PT eval pt was able to perform transfers and ambulation with independence to modified independence and no AD. Gait speed is slowed due to LE edema but overall no significant functional deficits noted. HEP issued for LE strengthening and edema management. See below for any follow-up Physical Therapy or equipment needs. PT is signing off. Thank you for this referral.        Recommendations for follow up therapy are one component of a multi-disciplinary discharge planning process, led by the attending physician.  Recommendations may be updated based on patient status, additional functional criteria and insurance authorization.  Follow Up Recommendations       Assistance Recommended at Discharge PRN  Patient can return home with the following  Assist for transportation    Equipment Recommendations None recommended by PT  Recommendations for Other Services       Functional Status Assessment Patient has not had a recent decline in their functional status     Precautions / Restrictions Precautions Precautions: Fall Restrictions Weight Bearing Restrictions: No      Mobility  Bed Mobility Overal bed mobility: Independent                  Transfers Overall transfer level: Modified independent Equipment used: None                    Ambulation/Gait Ambulation/Gait assistance: Independent Gait Distance (Feet): 25 Feet Assistive device: None Gait Pattern/deviations: Step-through pattern, Decreased  stride length, Trunk flexed Gait velocity: Decreased Gait velocity interpretation: <1.31 ft/sec, indicative of household ambulator   General Gait Details: In room only. No deviation noted, but slow and guarded due to LE edema.  Stairs            Wheelchair Mobility    Modified Rankin (Stroke Patients Only)       Balance Overall balance assessment: No apparent balance deficits (not formally assessed)                                           Pertinent Vitals/Pain Pain Assessment Pain Assessment: Faces Faces Pain Scale: Hurts a little bit Pain Location: BLE Pain Descriptors / Indicators: Discomfort Pain Intervention(s): Limited activity within patient's tolerance, Monitored during session, Repositioned    Home Living Family/patient expects to be discharged to:: Shelter/Homeless                 Home Equipment: [employer] Mor - single point      Prior Function Prior Level of Function : Independent/Modified Independent                     Hand Dominance        Extremity/Trunk Assessment   Upper Extremity Assessment Upper Extremity Assessment: Generalized weakness    Lower Extremity Assessment Lower Extremity Assessment: Generalized weakness    Cervical / Trunk Assessment Cervical / Trunk Assessment: Normal  Communication   Communication: No difficulties  Cognition Arousal/Alertness: Awake/alert Behavior During Therapy: WFL for tasks assessed/performed Overall Cognitive Status: No family/caregiver present to determine baseline cognitive functioning                                 General Comments: answered questions appropriately this session. some tangential responses.        General Comments      Exercises Other Exercises Other Exercises: Reviewed HEP with pt and handout given - Medbridge access code: [facility]   Assessment/Plan    PT Assessment Patient does not need any further PT services  PT Problem List          PT Treatment Interventions      PT Goals (Current goals can be found in the Care Plan section)  Acute Rehab PT Goals Patient Stated Goal: Decrease swelling in LE's PT Goal Formulation: All assessment and education complete, DC therapy Time For Goal Achievement: 11/02/22 Potential to Achieve Goals: Good    Frequency       Co-evaluation               AM-PAC PT "6 Clicks" Mobility  Outcome Measure Help needed turning from your back to your side while in a flat bed without using bedrails?: None Help needed moving from lying on your back to sitting on the side of a flat bed without using bedrails?: None Help needed moving to and from a bed to a chair (including a wheelchair)?: None Help needed standing up from a chair using your arms (e.g., wheelchair or bedside chair)?: None Help needed to walk in hospital room?: None Help needed climbing 3-5 steps with a railing? : A Little 6 Click Score: 23    End of Session   Activity Tolerance: Patient tolerated treatment well Patient left: in bed;with call bell/phone within reach (LE's elevated) Nurse Communication: Mobility status PT Visit Diagnosis: Difficulty in walking, not elsewhere classified (R26.2)    Time: 1130-1200 PT Time Calculation (min) (ACUTE ONLY): 30 min   Charges:   PT Evaluation $PT Eval Low Complexity: 1 Low PT Treatments $Therapeutic Activity: 8-22 mins        Conni Slipper, PT, DPT Acute Rehabilitation Services Secure Chat Preferred Office: [phone]   Marylynn Pearson 10/26/2022, 1:36 PM

## 2022-10-26 NOTE — Progress Notes (Signed)
   Subjective:  Alexandria Schultz is a 67 y.o. person living with a history of possible CHF, diabetes, hypertension, and delusional disorder who presents with shortness of breath and lower extremity swelling, admitted for rule out of CHF exacerbation.   Currently, the patient notes reduced shortness of breath and no issues walking to the restroom. Also reports that leg swelling appear improved from baseline. States that she stays at the [facility] when it is open and sleeps on the floor. On other nights, she sleeps on the street. She feels as though her life is in danger on the streets and that people out there want to harm her.  Of note, she does not have a PCP at this time. Patient was previously seen by psychiatrist and is not interested in being evaluated by one during admission.   Interval Events: None    Objective:  Vital Signs:   Temp:  97.9 F (36.6 C)  Pulse Rate:  [66-89] 72  Resp:  [18] 18  BP: (140-173)/(70-98) 143/88  SpO2:  [100 %] 100 %  Weight:  [127 kg] 127 kg   Intake/Output:  No intake or output data in the 24 hours ending 10/26/22 1610    Physical Exam: General: Well-developed, well-nourished, in no acute distress; alert, appropriate and cooperative throughout examination.  Lungs:  Normal respiratory effort. Clear to auscultation BL without crackles or wheezes.  Heart: RRR. S1 and S2 normal without gallop, murmur, or rubs.  Abdomen:  BS normoactive. Soft, Nondistended, non-tender.  Extremities: 2+ pitting edema, bilateral lower extremities     Labs:  Basic Metabolic Panel: Recent Labs  Lab 10/25/22 1058 10/26/22 0404  NA 137 138  K 4.2 4.0  CL 102 103  CO2 26 25  GLUCOSE 177* 123*  BUN 10 15  CREATININE 0.67 0.77  CALCIUM 9.0 9.3   CBC: Recent Labs  Lab 10/25/22 1058 10/26/22 0404  WBC 4.4 5.6  NEUTROABS 2.3  --   HGB 12.8 13.3  HCT 38.2 40.8  MCV 94.8 94.2  PLT 231 274    Cardiac echo: pending   Assessment & Plan:  Alexandria Schultz is a 67 y.o. person living with a history of possible CHF, diabetes, hypertension, and delusional disorder who presents with shortness of breath and lower extremity swelling, admitted for rule out of CHF exacerbation.   #Rule-out CHF exacerbation  #Lower extremity edema Patient's appears more volume overloaded on exam this morning with worsening lower extremity edema. Notably, patient's dyspnea has not worsened. She will likely benefit from resuming diuresis in the hospital to approach euvolemia.   - Start IV lasix 40 mg daily    - Follow-up echo, establish baseline heart function  - Daily BMP (monitor electrolytes, renal function)  - Strict I&Os, daily weights - PT/OT as able   #Suspected delusional disorder  Patient's speech is less tangential this morning. Thought content demonstrates possible paranoia. On admission, she demonstrated possible delusions with reference to "[place]" and "FBI". She will likely benefit from psychiatry consult during admission with initiation of anti-psychotic medication and close outpatient follow-up. Of note, patient was previously on a low-dose of risperidone (1 mg daily).  - Psychiatry consult    #Hypertension BP control has improved since admission. Currently 143/88 this morning.    - Continue lisinopril 10 mg daily (previous chronic medication)   Length of Stay: 0 day(s)   Signed:  Lyda Kalata, Medical Student 10/26/2022, 6:38 AM

## 2022-10-27 LAB — MAGNESIUM: Magnesium: 2 mg/dL (ref 1.7–2.4)

## 2022-10-27 LAB — BASIC METABOLIC PANEL
Anion gap: 6 (ref 5–15)
BUN: 14 mg/dL (ref 8–23)
CO2: 29 mmol/L (ref 22–32)
Calcium: 9.2 mg/dL (ref 8.9–10.3)
Chloride: 103 mmol/L (ref 98–111)
Creatinine, Ser: 0.66 mg/dL (ref 0.44–1.00)
GFR, Estimated: >60 mL/min (ref >60)
Glucose, Bld: 112 mg/dL — ABNORMAL HIGH (ref 70–99)
Potassium: 3.6 mmol/L (ref 3.5–5.1)
Sodium: 138 mmol/L (ref 135–145)

## 2022-10-27 LAB — GLUCOSE, CAPILLARY
Glucose-Capillary: 155 mg/dL — ABNORMAL HIGH (ref 70–99)
Glucose-Capillary: 96 mg/dL (ref 70–99)

## 2022-10-27 MED ORDER — FUROSEMIDE 40 MG PO TABS: 40.0000 mg | ORAL_TABLET | Freq: Every day | ORAL | 0 refills | Status: DC

## 2022-10-27 MED ORDER — LISINOPRIL 10 MG PO TABS: 10.0000 mg | ORAL_TABLET | Freq: Every day | ORAL | 0 refills | Status: DC

## 2022-10-27 MED ORDER — FUROSEMIDE 40 MG PO TABS
40.0000 mg | ORAL_TABLET | Freq: Every day | ORAL | Status: DC
  Administered 2022-10-27: 40 mg via ORAL
  Filled 2022-10-27: qty 1

## 2022-10-27 MED ORDER — RISPERIDONE 0.5 MG PO TABS: 0.5000 mg | ORAL_TABLET | Freq: Every evening | ORAL | 0 refills | Status: AC | PRN

## 2022-10-27 NOTE — Progress Notes (Signed)
Taxi vouchers x2 approved by [employer] supervisor [employer]. Voucher #1 is for transport from [employer] to [employer] at [address] and voucher #2 from [place] to the [facility] ([facility] at [address]. Vouchers tubed to unit, RN aware.   Dellie Burns, MSW, LCSW [phone] (coverage)

## 2022-10-27 NOTE — Discharge Summary (Signed)
Name: Alexandria Schultz MRN: [id number] DOB: 1956-06-20 67 y.o. PCP: Pcp, No  Date of Admission: 10/25/2022 10:02 AM Date of Discharge:  10/27/2022 Attending Physician: Dr. Antony Contras  DISCHARGE DIAGNOSIS:  Primary Problem: Acute on chronic heart failure with preserved ejection fraction (HFpEF) [facility])   Hospital Problems: Principal Problem:   Acute on chronic heart failure with preserved ejection fraction (HFpEF) (HCC) Active Problems:   Psychosis (HCC)   Anxiety state   Bilateral lower extremity edema    DISCHARGE MEDICATIONS:   Allergies as of 10/27/2022       Reactions   Ibuprofen    Chest pain        Medication List     STOP taking these medications    GOODYS BODY PAIN PO       TAKE these medications    furosemide 40 MG tablet Commonly known as: LASIX Take 1 tablet (40 mg total) by mouth daily.   lisinopril 10 MG tablet Commonly known as: ZESTRIL Take 1 tablet (10 mg total) by mouth daily. Start taking on: Oct 28, 2022   risperiDONE 0.5 MG tablet Commonly known as: RISPERDAL Take 1 tablet (0.5 mg total) by mouth at bedtime as needed (delusions, paranoia).   sodium chloride 0.65 % Soln nasal spray Commonly known as: OCEAN Place 1 spray into both nostrils as needed for congestion.        DISPOSITION AND FOLLOW-UP:  Alexandria Schultz was discharged from [facility] in Stable condition. At the hospital follow up visit please address:  Leg swelling/HFpEF Discharged with Lisinopril for HTN and lasix 40 mg daily  Will need to establish with PCP - provided contact info for community health and wellness Paranoia/delusional disorder: Started on PRN risperidal Needs outpatient psych follow up  Follow-up Recommendations: Consults: Psychiatry Labs: Basic Metabolic Profile Studies: none New Medications: Lasix, lisinopril, risperidal   Follow-up Appointments:  Follow-up Information     [facility] Follow up.    Contact information: [address] [place] [address] [phone]        [facility] Follow up.   Specialty: Behavioral Health Contact information: [address] [place] [address] [phone]                HOSPITAL COURSE:  Patient Summary: #Acute on chronic HFpEF #Lower extremity edema Patient's symptoms of worsening lower extremity swelling and dyspnea on exertion is most concerning for CHF exacerbation, particularly in setting of medication non-adherence for possible history of heart failure. Labs were reassuring (normal BMP, BNP, troponin). No signs of pulmonary edema on CXR, which correlates with patient's normal work of breathing on room air. On exam, the patient had 1+ pitting edema to the level of both knees, which improved after 2 days of IV diuresis. Bilateral lower extremity ultrasound was initially obtained in the ED and ruled out DVTs. Echocardiogram revealed an EF of >75%, mild LVH and normal LV diastolic parameters, although clinically, it appears that the patient has HFpEF. Will discharge with po lasix 40 mg daily.  #Hypertension  The patient was previously on lisinopril 10 mg daily, however, had been off of this for years. Restarted lisinopril in the setting of elevated BP.   #Delusional disorder  Patient's thought content demonstrated possible delusions with reference to "white house" and "FBI". No visual, auditory hallucinations noted. Presentation is most concerning for delusional disorder though timeline of patient's symptoms is not  known. Psychiatry was consulted and started the patient on risperidal 0.5 mg qhs PRN. Recommend outpatient psychiatry follow up. She does not pose an imminent risk to self or others and does not meet inpatient psychiatric admission criteria.     DISCHARGE INSTRUCTIONS:   Discharge Instructions     (HEART FAILURE PATIENTS) Call  MD:  Anytime you have any of the following symptoms: 1) 3 pound weight gain in 24 hours or 5 pounds in 1 week 2) shortness of breath, with or without a dry hacking cough 3) swelling in the hands, feet or stomach 4) if you have to sleep on extra pillows at night in order to breathe.   Complete by: As directed    Call MD for:  extreme fatigue   Complete by: As directed    Call MD for:  persistant dizziness or light-headedness   Complete by: As directed    Diet - low sodium heart healthy   Complete by: As directed    Discharge instructions   Complete by: As directed    Dear [name],  You were in the hospital because of your leg swelling. We gave you a medication through your IV to help get the fluid off and that improved your leg swelling. I recommend you get compression stockings and wear these, to help your leg swelling improve more. Continue to do your leg exercises, as well.  We have started a few new medicines-  1. Lasix 40 mg daily: please take this once a day to help keep fluid off of your legs 2. Lisinopril 10 mg daily: please take this once a day to help your blood pressure 3. Risperidone as needed, at night: you can take this medication to help you sleep  These meds were all sent to the [facility] at [address] [place] [address] Store number: [phone] Near the intersection of: [address]   We have provided you with two cab vouchers so you can get to the pharmacy and to the [facility].   You also need to establish with a primary care doctor and we recommend you get back to seeing a psychiatrist. I have provided the phone numbers for you to call to make those appointments. Take care!   Increase activity slowly   Complete by: As directed        SUBJECTIVE:  Alexandria Schultz was evaluated on the day of discharge. She continues to have an improvement in her leg swelling and feels well. She has been ambulating in her room without issues.  Discharge Vitals:    BP (!) 149/100 (BP Location: Right Arm)   Pulse 75   Temp (!) 97.5 F (36.4 C) (Oral)   Resp 16   Ht 6\' 2"  (1.88 m)   Wt 126.2 kg   SpO2 100%   BMI 35.72 kg/m   OBJECTIVE:  General: Pleasant, well-appearing female laying in bed. No acute distress. CV: RRR. No murmurs, rubs, or gallops. Trace LE edema Pulmonary: Lungs CTAB. Normal effort. No wheezing or rales. Skin: Warm and dry. No obvious rash or lesions. Neuro: A&Ox3. Moves all extremities. Normal sensation. No focal deficit. Psych: Normal mood and affect    Pertinent Labs, Studies, and Procedures:     Latest Ref Rng & Units 10/26/2022    4:04 AM 10/25/2022   10:58 AM  CBC  WBC 4.0 - 10.5 K/uL 5.6  4.4   Hemoglobin 12.0 - 15.0 g/dL 19.1  12.8  Hematocrit 36.0 - 46.0 % 40.8  38.2   Platelets 150 - 400 K/uL 274  231        Latest Ref Rng & Units 10/27/2022    1:10 AM 10/26/2022    4:04 AM 10/25/2022   10:58 AM  CMP  Glucose 70 - 99 mg/dL 914  782  956   BUN 8 - 23 mg/dL 14  15  10    Creatinine 0.44 - 1.00 mg/dL 2.13  0.86  5.78   Sodium 135 - 145 mmol/L 138  138  137   Potassium 3.5 - 5.1 mmol/L 3.6  4.0  4.2   Chloride 98 - 111 mmol/L 103  103  102   CO2 22 - 32 mmol/L 29  25  26    Calcium 8.9 - 10.3 mg/dL 9.2  9.3  9.0   Total Protein 6.5 - 8.1 g/dL   6.8   Total Bilirubin 0.3 - 1.2 mg/dL   0.6   Alkaline Phos 38 - 126 U/L   78   AST 15 - 41 U/L   19   ALT 0 - 44 U/L   19     ECHOCARDIOGRAM COMPLETE  Result Date: 10/26/2022    ECHOCARDIOGRAM REPORT   Patient Name:   Alexandria Schultz Date of Exam: 10/26/2022 Medical Rec #:  [id number]     Height:       74.0 in Accession #:    [id number]    Weight:       278.2 lb Date of Birth:  1956/01/13      BSA:          2.501 m Patient Age:    66 years      BP:           122/72 mmHg Patient Gender: F             HR:           71 bpm. Exam Location:  Inpatient Procedure: 2D Echo, Cardiac Doppler and Color Doppler Indications:    CHF I50.31  History:        Patient has no prior history of  Echocardiogram examinations.                 CHF; Signs/Symptoms:Edema.  Sonographer:    Dondra Prader RVT RCS Referring Phys: [phone] [facility]  Sonographer Comments: Suboptimal apical window and suboptimal subcostal window. Declined Definity IMPRESSIONS  1. Left ventricular ejection fraction, by estimation, is >75%. The left ventricle has hyperdynamic function. The left ventricle has no regional wall motion abnormalities. There is mild concentric left ventricular hypertrophy. Left ventricular diastolic parameters were normal.  2. Right ventricular systolic function is normal. The right ventricular size is normal.  3. The mitral valve is normal in structure. Trivial mitral valve regurgitation. No evidence of mitral stenosis.  4. The aortic valve is tricuspid. Aortic valve regurgitation is not visualized. No aortic stenosis is present.  5. The inferior vena cava is normal in size with greater than 50% respiratory variability, suggesting right atrial pressure of 3 mmHg. Comparison(s): No prior Echocardiogram. FINDINGS  Left Ventricle: Left ventricular ejection fraction, by estimation, is >75%. The left ventricle has hyperdynamic function. The left ventricle has no regional wall motion abnormalities. The left ventricular internal cavity size was normal in size. There is mild concentric left ventricular hypertrophy. Left ventricular diastolic parameters were normal. Right Ventricle: The right ventricular size is normal. Right ventricular systolic function is normal. Left Atrium: Left atrial  size was normal in size. Right Atrium: Right atrial size was normal in size. Pericardium: There is no evidence of pericardial effusion. Mitral Valve: The mitral valve is normal in structure. Mild mitral annular calcification. Trivial mitral valve regurgitation. No evidence of mitral valve stenosis. Tricuspid Valve: The tricuspid valve is normal in structure. Tricuspid valve regurgitation is mild . No evidence of tricuspid stenosis.  Aortic Valve: The aortic valve is tricuspid. Aortic valve regurgitation is not visualized. No aortic stenosis is present. Aortic valve mean gradient measures 5.5 mmHg. Aortic valve peak gradient measures 11.5 mmHg. Aortic valve area, by VTI measures 2.79  cm. Pulmonic Valve: The pulmonic valve was normal in structure. Pulmonic valve regurgitation is not visualized. No evidence of pulmonic stenosis. Aorta: The aortic root is normal in size and structure. Venous: The inferior vena cava is normal in size with greater than 50% respiratory variability, suggesting right atrial pressure of 3 mmHg. IAS/Shunts: No atrial level shunt detected by color flow Doppler.  LEFT VENTRICLE PLAX 2D LVIDd:         4.80 cm   Diastology LVIDs:         2.60 cm   LV e' medial:    9.39 cm/s LV PW:         1.20 cm   LV E/e' medial:  9.4 LV IVS:        1.10 cm   LV e' lateral:   8.55 cm/s LVOT diam:     1.90 cm   LV E/e' lateral: 10.3 LV SV:         97 LV SV Index:   39 LVOT Area:     2.84 cm  RIGHT VENTRICLE             IVC RV Basal diam:  3.40 cm     IVC diam: 1.90 cm RV S prime:     16.10 cm/s TAPSE (M-mode): 2.8 cm LEFT ATRIUM             Index        RIGHT ATRIUM           Index LA diam:        4.80 cm 1.92 cm/m   RA Area:     18.25 cm LA Vol (A2C):   62.5 ml 24.99 ml/m  RA Volume:   53.70 ml  21.47 ml/m LA Vol (A4C):   58.3 ml 23.31 ml/m LA Biplane Vol: 62.6 ml 25.03 ml/m  AORTIC VALVE                     PULMONIC VALVE AV Area (Vmax):    2.58 cm      PV Vmax:       1.17 m/s AV Area (Vmean):   2.63 cm      PV Peak grad:  5.5 mmHg AV Area (VTI):     2.79 cm AV Vmax:           169.50 cm/s AV Vmean:          110.000 cm/s AV VTI:            0.346 m AV Peak Grad:      11.5 mmHg AV Mean Grad:      5.5 mmHg LVOT Vmax:         154.00 cm/s LVOT Vmean:        102.000 cm/s LVOT VTI:          0.341 m LVOT/AV VTI ratio: 0.99  AORTA Ao Root diam: 3.10 cm Ao Asc diam:  3.60 cm MITRAL VALVE                TRICUSPID VALVE MV Area (PHT): 3.39  cm     TR Peak grad:   10.0 mmHg MV Decel Time: 224 msec     TR Vmax:        158.00 cm/s MV E velocity: 88.40 cm/s MV A velocity: 101.00 cm/s  SHUNTS MV E/A ratio:  0.88         Systemic VTI:  0.34 m                             Systemic Diam: 1.90 cm Olga Millers MD Electronically signed by Olga Millers MD Signature Date/Time: 10/26/2022/2:39:10 PM    Final    VAS [place] LOWER EXTREMITY VENOUS (DVT) (ONLY [facility] & [facility])  Result Date: 10/25/2022  Lower Venous DVT Study Patient Name:  Alexandria Schultz  Date of Exam:   10/25/2022 Medical Rec #: [id number]      Accession #:    [id number] Date of Birth: [birth year]       Patient Gender: F Patient Age:   88 years Exam Location:  [facility] Procedure:      VAS [place] LOWER EXTREMITY VENOUS (DVT) Referring Phys: Glynn Octave --------------------------------------------------------------------------------  Indications: Edema.  Limitations: Poor ultrasound/tissue interface, body habitus and subcutaneous edema. Comparison Study: No previous exams Performing Technologist: Jody Hill RVT, RDMS  Examination Guidelines: A complete evaluation includes B-mode imaging, spectral Doppler, color Doppler, and power Doppler as needed of all accessible portions of each vessel. Bilateral testing is considered an integral part of a complete examination. Limited examinations for reoccurring indications may be performed as noted. The reflux portion of the exam is performed with the patient in reverse Trendelenburg.  +---------+---------------+---------+-----------+----------+-------------------+ RIGHT    CompressibilityPhasicitySpontaneityPropertiesThrombus Aging      +---------+---------------+---------+-----------+----------+-------------------+ CFV      Full           Yes      Yes                                      +---------+---------------+---------+-----------+----------+-------------------+ SFJ      Full                                                              +---------+---------------+---------+-----------+----------+-------------------+ FV Prox  Full           Yes      Yes                                      +---------+---------------+---------+-----------+----------+-------------------+ FV Mid   Full           Yes      Yes                                      +---------+---------------+---------+-----------+----------+-------------------+ FV DistalFull           Yes  Yes                                      +---------+---------------+---------+-----------+----------+-------------------+ PFV      Full                                                             +---------+---------------+---------+-----------+----------+-------------------+ POP      Full           Yes      Yes                                      +---------+---------------+---------+-----------+----------+-------------------+ PTV                                                   Not well visualized +---------+---------------+---------+-----------+----------+-------------------+ PERO                                                  Not well visualized +---------+---------------+---------+-----------+----------+-------------------+   +---------+---------------+---------+-----------+----------+-------------------+ LEFT     CompressibilityPhasicitySpontaneityPropertiesThrombus Aging      +---------+---------------+---------+-----------+----------+-------------------+ CFV      Full           Yes      Yes                                      +---------+---------------+---------+-----------+----------+-------------------+ SFJ      Full                                                             +---------+---------------+---------+-----------+----------+-------------------+ FV Prox  Full           Yes      Yes                                      +---------+---------------+---------+-----------+----------+-------------------+ FV  Mid   Full           Yes      Yes                                      +---------+---------------+---------+-----------+----------+-------------------+ FV DistalFull           Yes      Yes                                      +---------+---------------+---------+-----------+----------+-------------------+  PFV      Full                                                             +---------+---------------+---------+-----------+----------+-------------------+ POP      Full           Yes      Yes                                      +---------+---------------+---------+-----------+----------+-------------------+ PTV      Full                                                             +---------+---------------+---------+-----------+----------+-------------------+ PERO     Full                                         Not well visualized +---------+---------------+---------+-----------+----------+-------------------+     Summary: BILATERAL: -No evidence of popliteal cyst, bilaterally. -Diffuse subcutaneous edema, bilaterally. RIGHT: - There is no evidence of deep vein thrombosis in the lower extremity. However, portions of this examination were limited- see technologist comments above.  LEFT: - There is no evidence of deep vein thrombosis in the lower extremity. However, portions of this examination were limited- see technologist comments above.  *See table(s) above for measurements and observations. Electronically signed by Gerarda Fraction on 10/25/2022 at 5:24:25 PM.    Final    DG Chest 2 View  Result Date: 10/25/2022 CLINICAL DATA:  Shortness of breath EXAM: CHEST - 2 VIEW COMPARISON:  X-ray 09/22/2019 report only.  Images are not available FINDINGS: Normal cardiopericardial silhouette with calcified tortuous aorta. No consolidation, pneumothorax or effusion. No edema. Mild interstitial prominence. This could be chronic. IMPRESSION: Tortuous ectatic aorta. Interstitial  prominence. This could be chronic but please correlate with any prior imaging. Short follow-up may also be useful Electronically Signed   By: Karen Kays M.D.   On: 10/25/2022 11:18     Signed: Elza Rafter, D.O.  Internal Medicine Resident, PGY-2 [facility]  Pager: [phone] 1:01 PM, 10/27/2022

## 2023-11-05 ENCOUNTER — Emergency Department (HOSPITAL_COMMUNITY)
Admission: EM | Admit: 2023-11-05 | Discharge: 2023-11-05 | Disposition: A | Discharge status: Home / Self Care | Attending: Student | Admitting: Student

## 2023-11-05 DIAGNOSIS — E119 Type 2 diabetes mellitus without complications: Secondary | ICD-10-CM | POA: Insufficient documentation

## 2023-11-05 DIAGNOSIS — M6282 Rhabdomyolysis: Secondary | ICD-10-CM | POA: Diagnosis not present

## 2023-11-05 DIAGNOSIS — I110 Hypertensive heart disease with heart failure: Secondary | ICD-10-CM | POA: Insufficient documentation

## 2023-11-05 DIAGNOSIS — R519 Headache, unspecified: Secondary | ICD-10-CM | POA: Diagnosis present

## 2023-11-05 DIAGNOSIS — E785 Hyperlipidemia, unspecified: Secondary | ICD-10-CM | POA: Diagnosis not present

## 2023-11-05 DIAGNOSIS — E860 Dehydration: Secondary | ICD-10-CM | POA: Insufficient documentation

## 2023-11-05 DIAGNOSIS — M791 Myalgia, unspecified site: Secondary | ICD-10-CM

## 2023-11-05 LAB — MAGNESIUM: Magnesium: 2 mg/dL (ref 1.7–2.4)

## 2023-11-05 LAB — COMPREHENSIVE METABOLIC PANEL WITH GFR
ALT: 22 U/L (ref 0–44)
AST: 26 U/L (ref 15–41)
Albumin: 3.8 g/dL (ref 3.5–5.0)
Alkaline Phosphatase: 56 U/L (ref 38–126)
Anion gap: 12 (ref 5–15)
BUN: 11 mg/dL (ref 8–23)
CO2: 23 mmol/L (ref 22–32)
Calcium: 9.5 mg/dL (ref 8.9–10.3)
Chloride: 105 mmol/L (ref 98–111)
Creatinine, Ser: 0.62 mg/dL (ref 0.44–1.00)
GFR, Estimated: >60 mL/min (ref >60)
Glucose, Bld: 116 mg/dL — ABNORMAL HIGH (ref 70–99)
Potassium: 3.1 mmol/L — ABNORMAL LOW (ref 3.5–5.1)
Sodium: 140 mmol/L (ref 135–145)
Total Bilirubin: 0.6 mg/dL (ref 0.0–1.2)
Total Protein: 7.1 g/dL (ref 6.5–8.1)

## 2023-11-05 LAB — CBC WITH DIFFERENTIAL/PLATELET
Abs Immature Granulocytes: 0.01 10*3/uL (ref 0.00–0.07)
Basophils Absolute: 0.1 10*3/uL (ref 0.0–0.1)
Basophils Relative: 1 %
Eosinophils Absolute: 0 10*3/uL (ref 0.0–0.5)
Eosinophils Relative: 1 %
HCT: 42.1 % (ref 36.0–46.0)
Hemoglobin: 14.4 g/dL (ref 12.0–15.0)
Immature Granulocytes: 0 %
Lymphocytes Relative: 39 %
Lymphs Abs: 1.5 10*3/uL (ref 0.7–4.0)
MCH: 30.8 pg (ref 26.0–34.0)
MCHC: 34.2 g/dL (ref 30.0–36.0)
MCV: 90 fL (ref 80.0–100.0)
Monocytes Absolute: 0.5 10*3/uL (ref 0.1–1.0)
Monocytes Relative: 13 %
Neutro Abs: 1.8 10*3/uL (ref 1.7–7.7)
Neutrophils Relative %: 46 %
Platelets: 328 10*3/uL (ref 150–400)
RBC: 4.68 MIL/uL (ref 3.87–5.11)
RDW: 12 % (ref 11.5–15.5)
WBC: 3.9 10*3/uL — ABNORMAL LOW (ref 4.0–10.5)
nRBC: 0 % (ref 0.0–0.2)

## 2023-11-05 LAB — ETHANOL: Alcohol, Ethyl (B): <15 mg/dL (ref <15)

## 2023-11-05 LAB — BRAIN NATRIURETIC PEPTIDE: B Natriuretic Peptide: 15.9 pg/mL (ref 0.0–100.0)

## 2023-11-05 LAB — I-STAT CG4 LACTIC ACID, ED: Lactic Acid, Venous: 1 mmol/L (ref 0.5–1.9)

## 2023-11-05 LAB — CK: Total CK: 595 U/L — ABNORMAL HIGH (ref 38–234)

## 2023-11-05 MED ORDER — SODIUM CHLORIDE 0.9 % IV BOLUS
1000.0000 mL | Freq: Once | INTRAVENOUS | Status: AC
  Administered 2023-11-05: 1000 mL via INTRAVENOUS

## 2023-11-05 MED ORDER — ACETAMINOPHEN 325 MG PO TABS
650.0000 mg | ORAL_TABLET | Freq: Four times a day (QID) | ORAL | Status: DC | PRN
  Administered 2023-11-05: 650 mg via ORAL
  Filled 2023-11-05: qty 2

## 2023-11-05 MED ORDER — POTASSIUM CHLORIDE CRYS ER 20 MEQ PO TBCR
40.0000 meq | EXTENDED_RELEASE_TABLET | Freq: Once | ORAL | Status: AC
  Administered 2023-11-05: 40 meq via ORAL
  Filled 2023-11-05: qty 2

## 2023-11-05 NOTE — ED Triage Notes (Signed)
 Pt BIB EMS [place] bus stop [place] she has been sleeping for 2 days, said she hurts all over. Aox4.

## 2023-11-05 NOTE — ED Provider Notes (Signed)
 [facility] [facility] AT [facility] Provider Note   CSN: [id number] Arrival date & time: 11/05/23  1806     History Chief Complaint  Patient presents with   Muscle Pain     Muscle Pain Pertinent negatives include no chest pain and no abdominal pain.   Alexandria Schultz is a 68 y.o. female presenting for leg pain and swelling, as well as occipital headache.  Per EMS, she has been at a bus stop for the last 2 days and the grocery store in front was who had alerted EMS due to concern that she had not been moving.  She has a previous medical history of type 2 diabetes, hyperlipidemia, hypertension, and psychosis.  Also noted to have chronic heart failure with preserved ejection fraction.  Notably she was recently discharged from hospital May 2 after having been admitted for peripheral edema, and exacerbation of her chronic heart failure.  She does endorse alcohol use, but states that she has not used any alcohol in the last several days.  She also states that she has had frequent issues with constipation.  She states that the last time she had a bowel movement was several days ago.   Patient's recorded medical, surgical, social, medication list and allergies were reviewed in the Snapshot window as part of the initial history.   Review of Systems   Review of Systems  Cardiovascular:  Positive for leg swelling. Negative for chest pain and palpitations.  Gastrointestinal:  Negative for abdominal distention and abdominal pain.  Musculoskeletal:  Positive for back pain.  Neurological:  Positive for weakness.  All other systems reviewed and are negative.   Physical Exam Updated Vital Signs BP (!) 148/100 (BP Location: Right Arm)   Pulse 100   Resp 16   Ht 6\' 2"  (1.88 m)   Wt 126.2 kg   SpO2 99%   BMI 35.72 kg/m  Physical Exam Vitals and nursing note reviewed.  Constitutional:      General: She is not in acute distress.    Appearance: Normal appearance.  HENT:      Head: Normocephalic and atraumatic.     Mouth/Throat:     Mouth: Mucous membranes are moist.     Pharynx: Oropharynx is clear.  Eyes:     Extraocular Movements: Extraocular movements intact.     Conjunctiva/sclera: Conjunctivae normal.     Pupils: Pupils are equal, round, and reactive to light.  Cardiovascular:     Rate and Rhythm: Normal rate and regular rhythm.     Pulses: Normal pulses.     Heart sounds: Normal heart sounds. No murmur heard.    No friction rub. No gallop.  Pulmonary:     Effort: Pulmonary effort is normal.     Breath sounds: Normal breath sounds.  Abdominal:     General: Abdomen is flat. Bowel sounds are normal.     Palpations: Abdomen is soft.  Musculoskeletal:        General: Normal range of motion.     Cervical back: Normal range of motion and neck supple.     Right lower leg: 1+ Pitting Edema present.     Left lower leg: 1+ Pitting Edema present.     Comments: +1 pitting edema noted to the bilateral lower extremities at the medial malleoli and the shins  Skin:    General: Skin is warm and dry.     Capillary Refill: Capillary refill takes less than 2 seconds.  Neurological:  General: No focal deficit present.     Mental Status: She is alert. Mental status is at baseline.  Psychiatric:        Mood and Affect: Mood normal.      ED Course/ Medical Decision Making/ A&P    Procedures Procedures   Medications Ordered in ED Medications  acetaminophen  (TYLENOL ) tablet 650 mg (has no administration in time range)  sodium chloride 0.9 % bolus 1,000 mL (1,000 mLs Intravenous New Bag/Given 11/05/23 2102)  potassium chloride SA (KLOR-CON M) CR tablet 40 mEq (40 mEq Oral Given 11/05/23 2102)    Medical Decision Making:   Alexandria Schultz is a 68 y.o. female who presented to the ED today with leg pain and swelling detailed above.    Handoff received from EMS.  Complete initial physical exam performed, notably the patient  was alert and oriented in no  apparent distress.  She does have some +1 pitting edema noted to the bilateral lower extremities.   Lung sounds are clear and equal bilaterally good air entry to all fields.  Reviewed and confirmed nursing documentation for past medical history, family history, social history.    Initial Assessment:   With the patient's presentation of leg pain and swelling, most likely diagnosis is acute on chronic heart failure. Other diagnoses were considered including (but not limited to) alcohol withdrawal. These are considered less likely due to history of present illness and physical exam findings.     Initial Plan:  Obtain ethanol level due to history of chronic EtOH use as well as obtain i-STAT lactate Screening labs including CBC and Metabolic panel to evaluate for infectious or metabolic etiology of disease.  Urinalysis with reflex culture ordered to evaluate for UTI or relevant urologic/nephrologic pathology.  EKG to evaluate for cardiac pathology Objective evaluation as below reviewed   Initial Study Results:   Laboratory  All laboratory results reviewed without evidence of clinically relevant pathology.   Exceptions include: Elevated CK5 95, potassium 3.1  EKG EKG was reviewed independently. Rate, rhythm, axis, intervals all examined and without medically relevant abnormality. ST segments without concerns for elevations.     Reassessment and Plan:   Given prior findings of elevated CK, patient given 1 L of IV fluids.  She subjectively has improvement with this.  Further noted decreased potassium of 3.1 and replenished with oral tablets.  Back pain managed with p.o. ibuprofen.   Plan to discharge with outpatient follow-up primary care when available.  Will provide referral to community care clinic.  Clinical Impression:  1. Muscle ache   2. Dehydration   3. Non-traumatic rhabdomyolysis      Discharge   Final Clinical Impression(s) / ED Diagnoses Final diagnoses:  Muscle ache   Dehydration  Non-traumatic rhabdomyolysis    Rx / DC Orders ED Discharge Orders     None         Juanetta Nordmann, PA 11/05/23 2208    Karlyn Overman, MD 11/06/23 1157

## 2023-11-05 NOTE — Discharge Instructions (Addendum)
 Referrals been given to you for community care clinic, recommend you follow-up within the coming week to continue to manage her conditions.  Further encouraged you to continue to have increased oral fluids, particularly clear liquids like water and or sports drinks.

## 2023-12-13 ENCOUNTER — Encounter (HOSPITAL_COMMUNITY): Payer: Self-pay

## 2023-12-13 ENCOUNTER — Emergency Department (HOSPITAL_COMMUNITY)

## 2023-12-13 ENCOUNTER — Emergency Department (HOSPITAL_COMMUNITY): Admission: EM | Admit: 2023-12-13 | Discharge: 2023-12-13 | Disposition: A | Discharge status: Home / Self Care

## 2023-12-13 ENCOUNTER — Other Ambulatory Visit: Payer: Self-pay

## 2023-12-13 DIAGNOSIS — M7989 Other specified soft tissue disorders: Secondary | ICD-10-CM | POA: Diagnosis present

## 2023-12-13 DIAGNOSIS — R600 Localized edema: Secondary | ICD-10-CM | POA: Insufficient documentation

## 2023-12-13 LAB — BRAIN NATRIURETIC PEPTIDE: B Natriuretic Peptide: 107.3 pg/mL — ABNORMAL HIGH (ref 0.0–100.0)

## 2023-12-13 LAB — BASIC METABOLIC PANEL WITH GFR
Anion gap: 6 (ref 5–15)
BUN: 10 mg/dL (ref 8–23)
CO2: 28 mmol/L (ref 22–32)
Calcium: 8.8 mg/dL — ABNORMAL LOW (ref 8.9–10.3)
Chloride: 105 mmol/L (ref 98–111)
Creatinine, Ser: 0.62 mg/dL (ref 0.44–1.00)
GFR, Estimated: >60 mL/min (ref >60)
Glucose, Bld: 97 mg/dL (ref 70–99)
Potassium: 3.6 mmol/L (ref 3.5–5.1)
Sodium: 139 mmol/L (ref 135–145)

## 2023-12-13 LAB — CBC
HCT: 36.8 % (ref 36.0–46.0)
Hemoglobin: 11.7 g/dL — ABNORMAL LOW (ref 12.0–15.0)
MCH: 30.8 pg (ref 26.0–34.0)
MCHC: 31.8 g/dL (ref 30.0–36.0)
MCV: 96.8 fL (ref 80.0–100.0)
Platelets: 281 10*3/uL (ref 150–400)
RBC: 3.8 MIL/uL — ABNORMAL LOW (ref 3.87–5.11)
RDW: 13.5 % (ref 11.5–15.5)
WBC: 5.1 10*3/uL (ref 4.0–10.5)
nRBC: 0 % (ref 0.0–0.2)

## 2023-12-13 MED ORDER — LISINOPRIL 10 MG PO TABS: 10.0000 mg | ORAL_TABLET | Freq: Every day | ORAL | 0 refills | Status: AC

## 2023-12-13 MED ORDER — FUROSEMIDE 20 MG PO TABS
40.0000 mg | ORAL_TABLET | Freq: Once | ORAL | Status: AC
  Administered 2023-12-13: 40 mg via ORAL
  Filled 2023-12-13: qty 2

## 2023-12-13 MED ORDER — FUROSEMIDE 40 MG PO TABS: 40.0000 mg | ORAL_TABLET | Freq: Every day | ORAL | 0 refills | Status: AC

## 2023-12-13 NOTE — Discharge Instructions (Signed)
 We have refilled your prescriptions to Lasix  and lisinopril .  Please take them.  Please follow-up with your primary doctor.

## 2023-12-13 NOTE — ED Triage Notes (Signed)
 Pt here for facial swelling and bilateral leg swelling for 5 days. States lower legs burning. No SHOB. No tongue swelling.

## 2023-12-13 NOTE — ED Provider Notes (Signed)
 [place] [facility] AT [facility] Provider Note   CSN: [id number] Arrival date & time: 12/13/23  1501     Patient presents with: Facial Swelling and Leg Swelling   Alexandria Schultz is a 68 y.o. female.   This is a 68 year old female presenting emergency department for edema.  Worsening for the past several days, cannot provide specific timeframe.  Not having chest pain, does note some intermittent shortness of breath, none currently.  No chest pain, nausea vomiting diarrhea.  Has not been taking prescribed medications which appears to be Lasix .          Prior to Admission medications   Medication Sig Start Date End Date Taking? Authorizing Provider  furosemide  (LASIX ) 40 MG tablet Take 1 tablet (40 mg total) by mouth daily. 12/13/23   Rolinda Climes, DO  lisinopril  (ZESTRIL ) 10 MG tablet Take 1 tablet (10 mg total) by mouth daily. 12/13/23   Rolinda Climes, DO  risperiDONE  (RISPERDAL ) 0.5 MG tablet Take 1 tablet (0.5 mg total) by mouth at bedtime as needed (delusions, paranoia). 10/27/22   Atway, Rayann N, DO  sodium chloride  (OCEAN) 0.65 % SOLN nasal spray Place 1 spray into both nostrils as needed for congestion.    [provider]    Allergies: Ibuprofen    Review of Systems  Updated Vital Signs BP (!) 145/85   Pulse 62   Temp 98.5 F (36.9 C) (Oral)   Resp 18   Ht 6' 2 (1.88 m)   Wt 126.2 kg   SpO2 100%   BMI 35.72 kg/m   Physical Exam Vitals and nursing note reviewed.  Constitutional:      General: She is not in acute distress.    Appearance: She is not toxic-appearing.  HENT:     Head: Normocephalic.     Nose: Nose normal.     Mouth/Throat:     Mouth: Mucous membranes are moist.   Eyes:     Conjunctiva/sclera: Conjunctivae normal.    Cardiovascular:     Rate and Rhythm: Normal rate and regular rhythm.  Pulmonary:     Effort: Pulmonary effort is normal.  Abdominal:     General: Abdomen is flat. There is no distension.      Tenderness: There is no abdominal tenderness. There is no guarding or rebound.   Musculoskeletal:     Right lower leg: Edema present.     Left lower leg: Edema present.   Skin:    General: Skin is warm.     Capillary Refill: Capillary refill takes less than 2 seconds.   Neurological:     Mental Status: She is alert and oriented to person, place, and time.   Psychiatric:        Mood and Affect: Mood normal.        Behavior: Behavior normal.     (all labs ordered are listed, but only abnormal results are displayed) Labs Reviewed  CBC - Abnormal; Notable for the following components:      Result Value   RBC 3.80 (*)    Hemoglobin 11.7 (*)    All other components within normal limits  BASIC METABOLIC PANEL WITH GFR - Abnormal; Notable for the following components:   Calcium 8.8 (*)    All other components within normal limits  BRAIN NATRIURETIC PEPTIDE - Abnormal; Notable for the following components:   B Natriuretic Peptide 107.3 (*)    All other components within normal limits  EKG: None  Radiology: DG Chest 1 View Result Date: 12/13/2023 CLINICAL DATA:  Facial bilateral leg swelling EXAM: CHEST  1 VIEW COMPARISON:  Chest radiograph dated 10/25/2022 FINDINGS: Normal lung volumes. No focal consolidations. No pleural effusion or pneumothorax. Enlarged cardiomediastinal silhouette is likely projectional. No acute osseous abnormality. IMPRESSION: No acute disease. Electronically Signed   By: Limin  Xu M.D.   On: 12/13/2023 19:11     Procedures   Medications Ordered in the ED  furosemide  (LASIX ) tablet 40 mg (has no administration in time range)    Clinical Course as of 12/13/23 2307  Fri Dec 13, 2023  2153 Admitted last year for similar edema: atient Summary: #Acute on chronic HFpEF #Lower extremity edema Patient's symptoms of worsening lower extremity swelling and dyspnea on exertion is most concerning for CHF exacerbation, particularly in setting of  medication non-adherence for possible history of heart failure. Labs were reassuring (normal BMP, BNP, troponin). No signs of pulmonary edema on CXR, which correlates with patient's normal work of breathing on room air. On exam, the patient had 1+ pitting edema to the level of both knees, which improved after 2 days of IV diuresis. Bilateral lower extremity ultrasound was initially obtained in the ED and ruled out DVTs. Echocardiogram revealed an EF of >75%, mild LVH and normal LV diastolic parameters, although clinically, it appears that the patient has HFpEF. Will discharge with po lasix  40 mg daily.    [TY]    Clinical Course User Index [TY] Rolinda Climes, DO                                 Medical Decision Making 68 year old female presenting emergency department for edema.  She is afebrile nontachycardic, slightly hypertensive.  Has a history of the same.  Maintaining oxygen saturation on room air and does not appear to be in respiratory distress.  Lungs are clear.  Chest x-ray without pulmonary edema.  Does have a mild elevation in her BNP.  Had an admission last year with normal echo.  Was discharged on Lasix  for peripheral edema, has been noncompliant with them secondary to some financial issues.  However, she states that she could pick the prescriptions up currently if I were to prescribe them.  Give them a dose of Lasix  here.  She has no significant metabolic derangements on BMP.  No leukocytosis to suggest systemic infection.  Patient stable for discharge at this time.  Amount and/or Complexity of Data Reviewed External Data Reviewed:     Details: See above  Risk Prescription drug management. Decision regarding hospitalization. Diagnosis or treatment significantly limited by social determinants of health. Risk Details: Poor health literacy      Final diagnoses:  Leg edema    ED Discharge Orders          Ordered    furosemide  (LASIX ) 40 MG tablet  Daily        12/13/23  2305    lisinopril  (ZESTRIL ) 10 MG tablet  Daily        12/13/23 2305               Rolinda Climes, DO 12/13/23 2307

## 2023-12-13 NOTE — ED Provider Triage Note (Cosign Needed)
 Emergency Medicine Provider Triage Evaluation Note  Alexandria Schultz , a 68 y.o. female  was evaluated in triage.  Pt complains of facial swelling/tingling and worsening lower extremity edema.  Symptoms have been worse since patient woke up this morning, she describes facial tingling and swelling that is worse than her baseline.  She also describes worsening lower extremity edema.  History of CHF, it is unclear if patient is taking her medications as directed.  She has also noticed worsening shortness of breath with exertion.  Denies chest pain, nausea/vomiting/abdominal pain/diarrhea.  Review of Systems  Positive: As above Negative: As above  Physical Exam  BP (!) 141/95 (BP Location: Left Arm)   Pulse 65   Temp 98.6 F (37 C)   Resp 18   Ht 6' 2 (1.88 m)   SpO2 100%   BMI 35.72 kg/m  Gen:   Awake, no distress   Resp:  Normal effort  MSK:   Moves extremities without difficulty  Other:  Bilateral lower extremity edema.  No obvious swelling of lips/tongue/face, patient is managing her secretions without difficulty and is not demonstrating any signs of airway compromise.  Medical Decision Making  Medically screening exam initiated at 4:09 PM.  Appropriate orders placed.  Alexandria Schultz was informed that the remainder of the evaluation will be completed by another provider, this initial triage assessment does not replace that evaluation, and the importance of remaining in the ED until their evaluation is complete.     Kendrick Pax, [place] 12/13/23 [phone]
